# Patient Record
Sex: Male | Born: 1955 | Race: White | Hispanic: No | Marital: Married | Smoking: Former smoker
Health system: Southern US, Community
[De-identification: ages and names within clinical notes are randomized; demographics above are authoritative.]

## PROBLEM LIST (undated history)

## (undated) DIAGNOSIS — M1990 Unspecified osteoarthritis, unspecified site: Secondary | ICD-10-CM

## (undated) DIAGNOSIS — R05 Cough: Secondary | ICD-10-CM

## (undated) DIAGNOSIS — J189 Pneumonia, unspecified organism: Secondary | ICD-10-CM

## (undated) DIAGNOSIS — R0600 Dyspnea, unspecified: Secondary | ICD-10-CM

## (undated) DIAGNOSIS — R0609 Other forms of dyspnea: Secondary | ICD-10-CM

## (undated) DIAGNOSIS — R3912 Poor urinary stream: Secondary | ICD-10-CM

## (undated) DIAGNOSIS — C61 Malignant neoplasm of prostate: Secondary | ICD-10-CM

## (undated) DIAGNOSIS — R399 Unspecified symptoms and signs involving the genitourinary system: Secondary | ICD-10-CM

## (undated) DIAGNOSIS — J449 Chronic obstructive pulmonary disease, unspecified: Secondary | ICD-10-CM

## (undated) DIAGNOSIS — R058 Other specified cough: Secondary | ICD-10-CM

## (undated) DIAGNOSIS — N529 Male erectile dysfunction, unspecified: Secondary | ICD-10-CM

## (undated) DIAGNOSIS — J984 Other disorders of lung: Secondary | ICD-10-CM

## (undated) DIAGNOSIS — D509 Iron deficiency anemia, unspecified: Secondary | ICD-10-CM

## (undated) DIAGNOSIS — J439 Emphysema, unspecified: Secondary | ICD-10-CM

## (undated) DIAGNOSIS — Z973 Presence of spectacles and contact lenses: Secondary | ICD-10-CM

## (undated) HISTORY — DX: Unspecified osteoarthritis, unspecified site: M19.90

## (undated) HISTORY — DX: Emphysema, unspecified: J43.9

## (undated) HISTORY — PX: PROSTATE BIOPSY: SHX241

## (undated) HISTORY — PX: TONSILLECTOMY: SUR1361

---

## 2010-11-20 ENCOUNTER — Ambulatory Visit (INDEPENDENT_AMBULATORY_CARE_PROVIDER_SITE_OTHER): Payer: Self-pay | Admitting: Pulmonary Disease

## 2010-11-20 ENCOUNTER — Institutional Professional Consult (permissible substitution): Payer: Self-pay | Admitting: Pulmonary Disease

## 2010-11-20 ENCOUNTER — Encounter: Payer: Self-pay | Admitting: Pulmonary Disease

## 2010-11-20 VITALS — BP 102/72 | HR 104 | Temp 97.4°F | Ht 70.0 in | Wt 137.2 lb

## 2010-11-20 DIAGNOSIS — J984 Other disorders of lung: Secondary | ICD-10-CM

## 2010-11-20 NOTE — Assessment & Plan Note (Signed)
The patient's history is most consistent with an episode of acute pneumonia, however his chest x-ray shows bilateral upper lobe fibrotic cavitary disease with hilar retraction.  This would indicate there is an underlying chronic process.  It is unclear whether he may have stage IV sarcoidosis, significant upper lobe bullous lung disease, or some other type of granulomatous process.  His recent PPD was negative.  Unfortunately, the patient never goes to the doctor, and is unsure if he has ever had a chest x-ray in the past.  He does mention some type of scan 30 years ago, and was told that he had scarring.  He currently is improving from a clinical standpoint with the antibiotic.  I have asked him to continue with this, and to stay completely away from cigarettes.  He will need a CT scan of the chest for further evaluation.  Unfortunately, the patient has no insurance, but we will work with him the best we can.

## 2010-11-20 NOTE — Progress Notes (Signed)
  Subjective:    Patient ID: Troy Burch, male    DOB: June 09, 1955, 55 y.o.   MRN: [id number]  HPI The patient is a 55 year old male who I've been asked to see for an abnormal chest x-ray.  The patient is a long-time smoker, and states that he never goes to see the doctor.  Approximately one month ago, he began to have fatigue, shortness of breath, cough, mild congestion, but no mucus production.  He then began to have chills and night sweats.  He was seen at an urgent care, and a chest x-ray showed significant bilateral upper lobe disease.  He was felt to have pneumonia, and was placed on Avelox.  The patient states he began to cough up yellow-green mucus in small quantities, but has not had any hemoptysis.  He definitely is breathing better and feels better since being on the antibiotic, but remains weak.  He denies any significant breathing issues prior to this.  The patient is unsure if he's ever had a chest x-ray, but does state that he had some type of scan 30 years ago and was told that he had "scarring".  He has no history of tuberculosis or TB exposure, and his PPD was negative one week ago according to the patient.  Since he has been sick, his appetite has been very poor and he has been losing weight.   Review of Systems  Constitutional: Positive for appetite change and unexpected weight change. Negative for fever.  HENT: Negative for ear pain, nosebleeds, congestion, sore throat, rhinorrhea, sneezing, trouble swallowing, dental problem, postnasal drip and sinus pressure.   Eyes: Negative for redness and itching.  Respiratory: Positive for cough and shortness of breath. Negative for chest tightness and wheezing.   Cardiovascular: Positive for chest pain. Negative for palpitations and leg swelling.  Gastrointestinal: Negative for nausea and vomiting.  Genitourinary: Negative for dysuria.  Musculoskeletal: Positive for joint swelling.  Skin: Negative for rash.  Neurological: Negative  for headaches.  Hematological: Does not bruise/bleed easily.  Psychiatric/Behavioral: Negative for dysphoric mood. The patient is not nervous/anxious.        Objective:   Physical Exam Constitutional:  Thin male, no acute distress  HENT:  Nares patent without discharge, but deviated septum to right with narrowing.   Oropharynx without exudate, palate and uvula are normal  Eyes:  Perrla, eomi, no scleral icterus  Neck:  No JVD, no TMG  Cardiovascular:  Normal rate, regular rhythm, no rubs or gallops.  No murmurs        Intact distal pulses  Pulmonary :  Normal breath sounds, no stridor or respiratory distress   No rales, rhonchi, or wheezing  Abdominal:  Soft, nondistended, bowel sounds present.  No tenderness noted.   Musculoskeletal:  No lower extremity edema noted.  Lymph Nodes:  No cervical lymphadenopathy noted  Skin:  No cyanosis noted  Neurologic:  Alert, appropriate, moves all 4 extremities without obvious deficit.         Assessment & Plan:

## 2010-11-20 NOTE — Patient Instructions (Signed)
Stay off cigarettes Finish up antibiotics Will schedule for ct chest to evaluate your abnormal xray.  Will call you with results.

## 2010-11-24 ENCOUNTER — Ambulatory Visit (INDEPENDENT_AMBULATORY_CARE_PROVIDER_SITE_OTHER)
Admission: RE | Admit: 2010-11-24 | Discharge: 2010-11-24 | Disposition: A | Discharge status: Home / Self Care | Payer: Self-pay | Source: Ambulatory Visit | Attending: Pulmonary Disease | Admitting: Pulmonary Disease

## 2010-11-24 DIAGNOSIS — J984 Other disorders of lung: Secondary | ICD-10-CM

## 2010-11-25 ENCOUNTER — Telehealth: Payer: Self-pay | Admitting: Pulmonary Disease

## 2010-11-25 DIAGNOSIS — J984 Other disorders of lung: Secondary | ICD-10-CM

## 2010-11-25 NOTE — Telephone Encounter (Signed)
Please let pt know that his chest ct shows the upper half of both lungs are destroyed by emphysema, and there are large bubbles where his lungs used to be that have become infected and fluid filled. No obvious cancer seen, but can't exclude 100%. He is to stay off cigarettes, finish up his antibiotics, and come back to see [place] in 2 mos to check on his progress. Will check another plain cxr next visit.  I spoke with Verlon Au and she is aware of pt results and kc's recs. She wrote down word for word to make pt aware of this. Pt is scheduled to come in 11/12 at 3:45 w/ cxr prior. Wife is awre of this and will inform pt. Nothing further was needed

## 2010-12-02 ENCOUNTER — Telehealth: Payer: Self-pay | Admitting: Pulmonary Disease

## 2010-12-02 DIAGNOSIS — J984 Other disorders of lung: Secondary | ICD-10-CM

## 2010-12-02 MED ORDER — HYDROCODONE-HOMATROPINE 5-1.5 MG/5ML PO SYRP: ORAL_SOLUTION | ORAL | Status: DC

## 2010-12-02 NOTE — Telephone Encounter (Signed)
I spoke with pt's spouse, Troy Burch, and she states the pt's cough is worse. Cough is keeping him up at night and he is coughing up very little phlegm and complains that his chest is sore and hurts. He finished the 10 day course of Avelox on Tues., 9/11r. She states he has no energy and earlier this week he noticed to small bulges in his groin area and thinks this is from the cough. Pls advise.

## 2010-12-02 NOTE — Telephone Encounter (Signed)
Can call in hydromet cough syrup 5 cc q6h prn.  6 ounces, no fills. I would like to see him in 2 weeks with cxr same day.

## 2010-12-02 NOTE — Telephone Encounter (Signed)
Called, spoke with pt's wife, Verlon Au.  She was informed [facility] recs pt take hydromet cough syrup 5 cc q6h prn and would like to see him in 2 wks with cxr same day.  She verbalized understanding of this and aware rx will be called into [facility].  2 wk OV scheduled for Oct 3 at 9:15am -- Verlon Au aware and will have pt come in a little earlier for cxr prior to appt.  Hydromet rx called into [facility] and CXR order placed.

## 2010-12-16 ENCOUNTER — Ambulatory Visit (INDEPENDENT_AMBULATORY_CARE_PROVIDER_SITE_OTHER)
Admission: RE | Admit: 2010-12-16 | Discharge: 2010-12-16 | Disposition: A | Discharge status: Home / Self Care | Payer: Self-pay | Source: Ambulatory Visit | Attending: Pulmonary Disease | Admitting: Pulmonary Disease

## 2010-12-16 ENCOUNTER — Encounter: Payer: Self-pay | Admitting: Pulmonary Disease

## 2010-12-16 ENCOUNTER — Ambulatory Visit (INDEPENDENT_AMBULATORY_CARE_PROVIDER_SITE_OTHER): Payer: Self-pay | Admitting: Pulmonary Disease

## 2010-12-16 VITALS — BP 98/62 | HR 106 | Temp 97.0°F | Ht 70.0 in | Wt 132.4 lb

## 2010-12-16 DIAGNOSIS — J984 Other disorders of lung: Secondary | ICD-10-CM

## 2010-12-16 DIAGNOSIS — J4489 Other specified chronic obstructive pulmonary disease: Secondary | ICD-10-CM

## 2010-12-16 DIAGNOSIS — J449 Chronic obstructive pulmonary disease, unspecified: Secondary | ICD-10-CM | POA: Insufficient documentation

## 2010-12-16 MED ORDER — AMOXICILLIN-POT CLAVULANATE 875-125 MG PO TABS: 1.0000 | ORAL_TABLET | Freq: Two times a day (BID) | ORAL | Status: AC

## 2010-12-16 NOTE — Progress Notes (Signed)
  Subjective:    Patient ID: Troy Burch, male    DOB: [birth year], 55 y.o.   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  He has been treated with a course of Avelox with significant improvement, and has also quit smoking.  He has had a CT of his chest which showed severe destruction of both upper lung zones, with large bullae with air-fluid levels.  The patient is greatly improved, and is breathing better, but is still coughing up purulent mucus.  He also has severe dyspnea on exertion.  He denies any fevers or chills.   Review of Systems  Constitutional: Negative for fever and unexpected weight change.  HENT: Negative for ear pain, nosebleeds, congestion, sore throat, rhinorrhea, sneezing, trouble swallowing, dental problem, postnasal drip and sinus pressure.   Eyes: Negative for redness and itching.  Respiratory: Positive for cough and shortness of breath. Negative for chest tightness and wheezing.   Cardiovascular: Negative for palpitations and leg swelling.  Gastrointestinal: Negative for nausea and vomiting.  Genitourinary: Negative for dysuria.  Musculoskeletal: Negative for joint swelling.  Skin: Negative for rash.  Neurological: Negative for headaches.  Hematological: Does not bruise/bleed easily.  Psychiatric/Behavioral: Negative for dysphoric mood. The patient is not nervous/anxious.        Objective:   Physical Exam Thin male in no acute distress Nose without purulence or discharge noted Chest bronchial breath sounds in the upper lung zones, no wheezes Cardiac exam with regular rate and rhythm Lower extremities without edema, no cyanosis noted Alert and oriented, moves all 4 extremities.       Assessment & Plan:

## 2010-12-16 NOTE — Progress Notes (Signed)
Addended by: Salli Quarry on: 12/16/2010 09:40 AM   Modules accepted: Orders

## 2010-12-16 NOTE — Assessment & Plan Note (Signed)
The patient has severe destruction of his upper lobes with superimposed infection.  I cannot exclude atypical infections, or possibly underlying cancer, however I do think he needs a more prolonged course of antibiotics given his persistent symptoms.  We'll therefore start him on Augmentin for the next 2 weeks.  He will need a followup chest x-ray in 8 weeks.

## 2010-12-16 NOTE — Patient Instructions (Signed)
Will treat longer with antibiotics given the severity of your lung disease and persistent infected mucus Will start spiriva one inhalation each am for next one month.  Let us know if helps breathing.  followup with me in 8weeks with chest xray same day.

## 2010-12-16 NOTE — Assessment & Plan Note (Signed)
The patient has never had pulmonary function studies, and unfortunately he has no insurance.  It is obvious that he has severe bullous emphysema, and we'll therefore start him on Spiriva as a trial.  I have also stressed to him the importance of staying off cigarettes.

## 2011-01-25 ENCOUNTER — Ambulatory Visit: Payer: Self-pay | Admitting: Pulmonary Disease

## 2011-02-09 ENCOUNTER — Other Ambulatory Visit: Payer: Self-pay | Admitting: Pulmonary Disease

## 2011-02-09 DIAGNOSIS — J984 Other disorders of lung: Secondary | ICD-10-CM

## 2011-02-09 DIAGNOSIS — J449 Chronic obstructive pulmonary disease, unspecified: Secondary | ICD-10-CM

## 2011-02-10 ENCOUNTER — Ambulatory Visit (INDEPENDENT_AMBULATORY_CARE_PROVIDER_SITE_OTHER)
Admission: RE | Admit: 2011-02-10 | Discharge: 2011-02-10 | Disposition: A | Discharge status: Home / Self Care | Payer: Self-pay | Source: Ambulatory Visit | Attending: Pulmonary Disease | Admitting: Pulmonary Disease

## 2011-02-10 ENCOUNTER — Ambulatory Visit (INDEPENDENT_AMBULATORY_CARE_PROVIDER_SITE_OTHER): Payer: Self-pay | Admitting: Pulmonary Disease

## 2011-02-10 ENCOUNTER — Encounter: Payer: Self-pay | Admitting: Pulmonary Disease

## 2011-02-10 VITALS — BP 112/72 | HR 101 | Ht 70.0 in | Wt 149.0 lb

## 2011-02-10 DIAGNOSIS — J984 Other disorders of lung: Secondary | ICD-10-CM

## 2011-02-10 DIAGNOSIS — J449 Chronic obstructive pulmonary disease, unspecified: Secondary | ICD-10-CM

## 2011-02-10 DIAGNOSIS — J4489 Other specified chronic obstructive pulmonary disease: Secondary | ICD-10-CM

## 2011-02-10 NOTE — Assessment & Plan Note (Addendum)
The pt has moderate copd by his spirometry today, and has had a positive response to spiriva.  Unfortunately, he does not have any insurance and cannot afford the medication, so we'll provide him with samples today and refer him to the patient assistance program.  Will also check an alpha one antitrypsin level today.

## 2011-02-10 NOTE — Assessment & Plan Note (Signed)
The patient has been treated with a prolonged course of Avelox, followed by another 2 weeks of Augmentin.  Overall, he had at least 4 weeks of antibiotics.  He feels much improved, his appetite has increased, and he is now gaining weight.  His congestion has significantly decreased and he is only coughing up small quantities of mucus currently.  His chest x-ray today shows some increase in density in the right upper lobe, and he continues to have air fluid levels in both apices.  I have explained to the patient this process is very difficult to treat with antibiotics because of poor blood flow, and he may ultimately need a lobectomy on the right.  Right now, he is satisfied with how he is doing, and there are no indications of a worsening infection.  I would like to culture his sputum, and monitor him at this point.  If he has worsening symptoms, he will obviously need antibiotics again and also consider a possible lobectomy.

## 2011-02-10 NOTE — Patient Instructions (Addendum)
Stay on spiriva one inhalation each am.  Will refer you to patient assistance program at drug company.  Albuterol inhaler 2 puffs up to every 4-6 hrs only if needed for emergencies. Will need to check sputum culture to see what kind of bugs are in your lungs. Will hold off on further antibiotics for now, but please call if you begin to feel worse or have worsening cough and congestion with increased mucus.  Will check finger stick blood test to look for hereditary form of emphysema.  followup with me in 3mos.

## 2011-02-10 NOTE — Progress Notes (Signed)
  Subjective:    Patient ID: Troy Burch, male    DOB: Aug 04, 1955, 55 y.o.   MRN: [id number]  HPI The patient comes in today for followup of his severe bullous lung disease and probably COPD.  He has had air-fluid levels and increased density in both upper lobes, and has been treated with a prolonged course of Avelox and then Augmentin.  He feels that he is clearly better from the last visit, with decreased cough and congestion, but continues to bring up purulent mucus.  His appetite has come back, and his weight is increasing.  He was also tried on Spiriva for presumed COPD, and saw significant improvement.  He has since run out of the medication, and we'll need to get on the patient assistance program.  He did have a chest x-ray today that shows persistent density in both apices, and it may be increased compared to the last film on the right.   Review of Systems  Constitutional: Negative for fever and unexpected weight change.  HENT: Negative for ear pain, nosebleeds, congestion, sore throat, rhinorrhea, sneezing, trouble swallowing, dental problem, postnasal drip and sinus pressure.   Eyes: Positive for itching. Negative for redness.  Respiratory: Positive for cough, shortness of breath and wheezing. Negative for chest tightness.   Cardiovascular: Negative for palpitations and leg swelling.  Gastrointestinal: Negative for nausea and vomiting.  Genitourinary: Negative for dysuria.  Musculoskeletal: Negative for joint swelling.  Skin: Negative for rash.  Neurological: Negative for headaches.  Hematological: Bruises/bleeds easily.  Psychiatric/Behavioral: Negative for dysphoric mood. The patient is not nervous/anxious.        Objective:   Physical Exam Thin male in no acute distress Nares without purulence or discharge noted Chest with decreased breath sounds but no wheezes or rhonchi Cardiac exam with regular rate and rhythm Lower extremities without edema, no cyanosis  noted Alert and oriented, moves all 4 extremities.       Assessment & Plan:

## 2011-02-12 ENCOUNTER — Other Ambulatory Visit: Payer: Self-pay

## 2011-02-12 DIAGNOSIS — J984 Other disorders of lung: Secondary | ICD-10-CM

## 2011-02-12 DIAGNOSIS — J449 Chronic obstructive pulmonary disease, unspecified: Secondary | ICD-10-CM

## 2011-02-15 LAB — RESPIRATORY CULTURE OR RESPIRATORY AND SPUTUM CULTURE

## 2011-02-26 ENCOUNTER — Ambulatory Visit (INDEPENDENT_AMBULATORY_CARE_PROVIDER_SITE_OTHER): Payer: Self-pay | Admitting: General Surgery

## 2011-02-26 ENCOUNTER — Encounter (INDEPENDENT_AMBULATORY_CARE_PROVIDER_SITE_OTHER): Payer: Self-pay | Admitting: General Surgery

## 2011-02-26 VITALS — BP 132/86 | HR 80 | Temp 98.4°F | Resp 20 | Ht 70.0 in | Wt 146.0 lb

## 2011-02-26 DIAGNOSIS — K4090 Unilateral inguinal hernia, without obstruction or gangrene, not specified as recurrent: Secondary | ICD-10-CM

## 2011-02-26 MED ORDER — TAMSULOSIN HCL 0.4 MG PO CAPS: 0.4000 mg | ORAL_CAPSULE | Freq: Every day | ORAL | Status: DC

## 2011-02-26 MED ORDER — ABDOMINAL BINDER/ELASTIC SMALL MISC: 1.0000 | Freq: Once | Status: DC

## 2011-02-26 NOTE — Patient Instructions (Signed)
We will get medical clearance from your lung doctor before scheduling surgery  Hernia, Surgical Repair A hernia occurs when an internal organ pushes out through a weak spot in the belly (abdominal) wall muscles. Hernias commonly occur in the groin and around the navel. Hernias often can be pushed back into place (reduced). Most hernias tend to get worse over time. Problems occur when abdominal contents get stuck in the opening (incarcerated hernia). The blood supply gets cut off (strangulated hernia). This is an emergency and needs surgery. Otherwise, hernia repair can be an elective procedure. This means you can schedule this at your convenience when an emergency is not present. Because complications can occur, if you decide to repair the hernia, it is best to do it soon. When it becomes an emergency procedure, there is increased risk of complications after surgery. CAUSES   Heavy lifting.   Obesity.   Prolonged coughing.   Straining to move your bowels.   Hernias can also occur through a cut (incision) by a surgeonafter an abdominal operation.  HOME CARE INSTRUCTIONS Before the repair:  Bed rest is not required. You may continue your normal activities, but avoid heavy lifting (more than 10 pounds) or straining. Cough gently. If you are a smoker, it is best to stop. Even the best hernia repair can break down with the continual strain of coughing.   Do not wear anything tight over your hernia. Do not try to keep it in with an outside bandage or truss. These can damage abdominal contents if they are trapped in the hernia sac.   Eat a normal diet. Avoid constipation. Straining over long periods of time to have a bowel movement will increase hernia size. It also can breakdown repairs. If you cannot do this with diet alone, laxatives or stool softeners may be used.  PRIOR TO SURGERY, SEEK IMMEDIATE MEDICAL CARE IF: You have problems (symptoms) of a trapped (incarcerated) hernia. Symptoms  include:  An oral temperature above 102 F (38.9 C) develops, or as your caregiver suggests.   Increasing abdominal pain.   Feeling sick to your stomach(nausea) and vomiting.   You stop passing gas or stool.   The hernia is stuck outside the abdomen, looks discolored, feels hard, or is tender.   You have any changes in your bowel habits or in the hernia that is unusual for you.  LET YOUR CAREGIVERS KNOW ABOUT THE FOLLOWING:  Allergies.   Medications taken including herbs, eye drops, over the counter medications, and creams.   Use of steroids (by mouth or creams).   Family or personal history of problems with anesthetics or Novocaine.   Possibility of pregnancy, if this applies.   Personal history of blood clots (thrombophlebitis).   Family or personal history of bleeding or blood problems.   Previous surgery.   Other health problems.  BEFORE THE PROCEDURE You should be present 1 hour prior to your procedure, or as directed by your caregiver.  AFTER THE PROCEDURE After surgery, you will be taken to the recovery area. A nurse will watch and check your progress there. Once you are awake, stable, and taking fluids well, you will be allowed to go home as long as there are no problems. Once home, an ice pack (wrapped in a light towel) applied to your operative site may help with discomfort. It may also keep the swelling down. Do not lift anything heavier than 10 pounds (4.55 kilograms). Take showers not baths. Do not drive while taking narcotics. Follow  instructions as suggested by your caregiver.  SEEK IMMEDIATE MEDICAL CARE IF: After surgery:  There is redness, swelling, or increasing pain in the wound.   There is pus coming from the wound.   There is drainage from a wound lasting longer than 1 day.   An unexplained oral temperature above 102 F (38.9 C) develops.   You notice a foul smell coming from the wound or dressing.   There is a breaking open of a wound (edged  not staying together) after the sutures have been removed.   You notice increasing pain in the shoulders (shoulder strap areas).   You develop dizzy episodes or fainting while standing.   You develop persistent nausea or vomiting.   You develop a rash.   You have difficulty breathing.   You develop any reaction or side effects to medications given.  MAKE SURE YOU:   Understand these instructions.   Will watch your condition.   Will get help right away if you are not doing well or get worse.  Document Released: 08/25/2000 Document Revised: 11/11/2010 Document Reviewed: 07/18/2007 [facility] Patient Information 2012 [place], [place].

## 2011-02-26 NOTE — Progress Notes (Signed)
Patient ID: Troy Burch, male   DOB: 1955/08/02, 55 y.o.   MRN: [id number]  Chief Complaint  Patient presents with  . New Evaluation    eval of LIH    HPI Troy Burch is a 55 y.o. male.   HPI  55 year old Caucasian male with severe COPD and emphysema referred by his primary care physician for evaluation of a left inguinal hernia. The patient stated that he developed a bulge in his left groin about 2 months ago. He states that it causes a lot of discomfort. It will occasionally burn. He denies any fevers, chills, nausea, vomiting, diarrhea, or constipation. He denies any dysuria.  He has emphysema and uses 2 inhalers. He also has a chronic cough. He denies any weight loss.  Past Medical History  Diagnosis Date  . COPD (chronic obstructive pulmonary disease)   . Emphysema of lung     Past Surgical History  Procedure Date  . Tonsillectomy     History reviewed. No pertinent family history.  Social History History  Substance Use Topics  . Smoking status: Former Smoker -- 2.0 packs/day for 30 years    Types: Cigarettes    Quit date: 11/07/2010  . Smokeless tobacco: Never Used  . Alcohol Use: Yes    No Known Allergies  Current Outpatient Prescriptions  Medication Sig Dispense Refill  . albuterol (PROVENTIL HFA;VENTOLIN HFA) 108 (90 BASE) MCG/ACT inhaler Inhale 2 puffs into the lungs every 6 (six) hours as needed.        . tiotropium (SPIRIVA) 18 MCG inhalation capsule Place 18 mcg into inhaler and inhale daily.          Review of Systems Review of Systems  Constitutional: Positive for fatigue. Negative for fever, appetite change and unexpected weight change.  HENT: Positive for hearing loss and congestion. Negative for neck pain and neck stiffness.   Eyes: Negative for discharge and itching.       +glasses  Respiratory: Positive for cough.        +DOE; no orthnopea; no PND  Cardiovascular: Negative for chest pain and palpitations.  Gastrointestinal:  Negative for nausea, diarrhea, constipation, blood in stool and abdominal distention.  Genitourinary: Negative for dysuria, frequency, hematuria and testicular pain.       +nocturia; + weak stream  Musculoskeletal: Negative.   Neurological: Negative for speech difficulty, light-headedness and headaches.  Hematological: Negative for adenopathy.  Psychiatric/Behavioral: Negative.     Blood pressure 132/86, pulse 80, temperature 98.4 F (36.9 C), temperature source Temporal, resp. rate 20, height 5\' 10"  (1.778 m), weight 146 lb (66.225 kg).  Physical Exam Physical Exam  Vitals reviewed. Constitutional: He is oriented to person, place, and time. He appears well-developed and well-nourished. No distress.  HENT:  Head: Normocephalic and atraumatic.  Eyes: Conjunctivae are normal. No scleral icterus.  Neck: Normal range of motion. Neck supple. No JVD present. No tracheal deviation present. No thyromegaly present.  Cardiovascular: Normal rate, regular rhythm and normal heart sounds.   Pulmonary/Chest: Effort normal. No respiratory distress. He has no decreased breath sounds. He has wheezes (slight inspir wheezes b/l). He has no rhonchi. He has no rales.  Abdominal: Soft. Bowel sounds are normal. He exhibits no distension. There is no tenderness. A hernia is present. Hernia confirmed positive in the left inguinal area (reducible).  Genitourinary: Penis normal.  Musculoskeletal: Normal range of motion. He exhibits no edema and no tenderness.  Lymphadenopathy:    He has no cervical adenopathy.  Neurological: He  is alert and oriented to person, place, and time. He exhibits normal muscle tone.  Skin: Skin is warm and dry. No rash noted. No erythema.  Psychiatric: He has a normal mood and affect. His behavior is normal. Judgment and thought content normal.    Data Reviewed Dr Teddy Spike note CXR  Assessment    Left inguinal hernia    Plan    We discussed the etiology of inguinal hernias.  We discussed the signs & symptoms of incarceration & strangulation.  We discussed non-operative and operative management. Because of his severe COPD, I think it will best to avoid general anesthesia.  Therefore, this leaves a open repair as the only surgical option.  I think we could do a open repair under MAC with regional block or spinal.  We will obtain a preop anesthesia consult.  We will also obtain medical clearance from Dr Shelle Iron.   The patient has elected to proceed with open left inguinal hernia with mesh    I described the procedure in detail.  The patient was given educational material. We discussed the risks and benefits including but not limited to bleeding, infection, chronic inguinal pain, nerve entrapment, hernia recurrence, mesh complications, hematoma formation, urinary retention, injury to the testicles, numbness in the groin, blood clots, injury to the surrounding structures, and anesthesia risk. We also discussed the typical post operative recovery course, including no heavy lifting for 4-6 weeks. I explained that the likelihood of improvement of their symptoms is good. I explained that he is at higher risk for perioperative complications including hernia recurrence because of his underlying COPD and emphysema  Will plan on perioperative Flomax. In the interim, he will be fitted with a truss.  Mary Sella. Andrey Campanile, MD, FACS General, Bariatric, & Minimally Invasive Surgery [facility], [place]         [facility] M 02/26/2011, 2:46 PM

## 2011-03-05 ENCOUNTER — Telehealth: Payer: Self-pay | Admitting: Pulmonary Disease

## 2011-03-05 DIAGNOSIS — J449 Chronic obstructive pulmonary disease, unspecified: Secondary | ICD-10-CM

## 2011-03-05 NOTE — Telephone Encounter (Signed)
Please let pt know that his genetic testing for hereditary form of emphysema is negative.  Good news.

## 2011-03-05 NOTE — Telephone Encounter (Signed)
[id number] x1 

## 2011-03-08 NOTE — Telephone Encounter (Signed)
Pt aware test results are negative per Dr. Shelle Iron.

## 2011-03-08 NOTE — Telephone Encounter (Signed)
Troy Burch (wife) returned call Troy Burch

## 2011-03-17 ENCOUNTER — Telehealth (INDEPENDENT_AMBULATORY_CARE_PROVIDER_SITE_OTHER): Payer: Self-pay | Admitting: General Surgery

## 2011-03-17 ENCOUNTER — Telehealth: Payer: Self-pay | Admitting: Pulmonary Disease

## 2011-03-17 NOTE — Telephone Encounter (Signed)
[facility] Jade, office is closed.  Meg, have you seen a surgery clearance document on this patient?  Thanks.

## 2011-03-17 NOTE — Telephone Encounter (Signed)
I have sent a request for clearance on 02/26/11 and 03/03/11. I called to check on clearance request 03/17/11. Spoke with [place] who will get message to Dr Teddy Spike nurse to contact me back re: this. Patient made aware this is what we are waiting on.

## 2011-03-18 NOTE — Telephone Encounter (Signed)
I spoke with Aundra Millet and she states she has not received surgery clearance document. I called to speak with Lesly Rubenstein and was advised by the nurse station to send her this message through epic. Will forward to [facility] to have her fax it to our triage fax [phone] Attn: Aundra Millet

## 2011-03-18 NOTE — Telephone Encounter (Signed)
Clearance re-faxed to provided number.

## 2011-03-18 NOTE — Telephone Encounter (Signed)
We have received fax and was placed in [facility] VIP folder. Please advise Dr. Shelle Iron, thanks

## 2011-03-18 NOTE — Telephone Encounter (Signed)
done

## 2011-03-19 NOTE — Telephone Encounter (Signed)
Paperwork faxed back to [facility] at Dr. Tawana Scale office

## 2011-03-19 NOTE — Telephone Encounter (Signed)
Clearance received. Dr Andrey Campanile to write orders. Patient made aware our scheduling department will be in touch with him.

## 2011-03-22 ENCOUNTER — Other Ambulatory Visit (INDEPENDENT_AMBULATORY_CARE_PROVIDER_SITE_OTHER): Payer: Self-pay | Admitting: General Surgery

## 2011-03-24 ENCOUNTER — Encounter: Payer: Self-pay | Admitting: Pulmonary Disease

## 2011-03-28 LAB — AFB CULTURE WITH SMEAR (NOT AT ARMC)

## 2011-03-31 ENCOUNTER — Encounter (HOSPITAL_COMMUNITY): Payer: Self-pay

## 2011-04-09 ENCOUNTER — Encounter (HOSPITAL_COMMUNITY)
Admission: RE | Admit: 2011-04-09 | Discharge: 2011-04-09 | Disposition: A | Discharge status: Home / Self Care | Payer: Self-pay | Source: Ambulatory Visit | Attending: General Surgery | Admitting: General Surgery

## 2011-04-09 ENCOUNTER — Encounter (HOSPITAL_COMMUNITY): Payer: Self-pay

## 2011-04-09 ENCOUNTER — Encounter (HOSPITAL_COMMUNITY)
Admission: RE | Admit: 2011-04-09 | Discharge: 2011-04-09 | Disposition: A | Discharge status: Home / Self Care | Payer: Self-pay | Source: Ambulatory Visit | Attending: Anesthesiology | Admitting: Anesthesiology

## 2011-04-09 HISTORY — DX: Pneumonia, unspecified organism: J18.9

## 2011-04-09 LAB — BASIC METABOLIC PANEL
CO2: 25 mEq/L (ref 19–32)
Calcium: 8.9 mg/dL (ref 8.4–10.5)
Chloride: 101 mEq/L (ref 96–112)
Creatinine, Ser: 0.98 mg/dL (ref 0.50–1.35)
Glucose, Bld: 91 mg/dL (ref 70–99)

## 2011-04-09 LAB — DIFFERENTIAL
Eosinophils Relative: 2 % (ref 0–5)
Lymphs Abs: 2 10*3/uL (ref 0.7–4.0)
Monocytes Absolute: 1.4 10*3/uL — ABNORMAL HIGH (ref 0.1–1.0)
Monocytes Relative: 9 % (ref 3–12)
Neutro Abs: 12 10*3/uL — ABNORMAL HIGH (ref 1.7–7.7)

## 2011-04-09 LAB — CBC
HCT: 30.7 % — ABNORMAL LOW (ref 39.0–52.0)
MCH: 21.3 pg — ABNORMAL LOW (ref 26.0–34.0)
MCV: 71.7 fL — ABNORMAL LOW (ref 78.0–100.0)
Platelets: 471 10*3/uL — ABNORMAL HIGH (ref 150–400)
RBC: 4.28 MIL/uL (ref 4.22–5.81)
WBC: 15.7 10*3/uL — ABNORMAL HIGH (ref 4.0–10.5)

## 2011-04-09 LAB — SURGICAL PCR SCREEN: Staphylococcus aureus: NEGATIVE

## 2011-04-09 MED ORDER — CEFAZOLIN SODIUM 1-5 GM-% IV SOLN: 1.0000 g | INTRAVENOUS | Status: DC

## 2011-04-09 NOTE — Pre-Procedure Instructions (Signed)
18 Troy Burch  04/09/2011   Your procedure is scheduled on:  04/15/2011  Report to [facility] at 0800 AM.  Call this number if you have problems the morning of surgery: [phone]   Remember:   Do not eat food:After Midnight.  May have clear liquids: up to 4 Hours before arrival.  Clear liquids include soda, tea, black coffee, apple or grape juice, broth.  Take these medicines the morning of surgery with A SIP OF WATER: Albuterol Inhaler (bring with you to hosp), Flomax, spiriva   Do not wear jewelry, make-up or nail polish.  Do not wear lotions, powders, or perfumes. You may wear deodorant.  Do not shave 48 hours prior to surgery.  Do not bring valuables to the hospital.  Contacts, dentures or bridgework may not be worn into surgery.  Leave suitcase in the car. After surgery it may be brought to your room.  For patients admitted to the hospital, checkout time is 11:00 AM the day of discharge.   Patients discharged the day of surgery will not be allowed to drive home.  Name and phone number of your driver: wife Troy Burch- [phone]  Special Instructions: CHG Shower Use Special Wash: 1/2 bottle night before surgery and 1/2 bottle morning of surgery.   Please read over the following fact sheets that you were given: Pain Booklet, Coughing and Deep Breathing, MRSA Information and Surgical Site Infection Prevention

## 2011-04-09 NOTE — Progress Notes (Signed)
ekg received from [facility]   11/14/2010

## 2011-04-12 NOTE — Consult Note (Addendum)
Anesthesia:  Patient is a 56 year old male scheduled for a left IHR on 04/15/11.  History includes COPD/emphysema, former smoker, and prior history of RUL PNA/cavitary lung disease.  According to Dr. Tawana Scale 02/26/11 note, he is planning to do an open repair in hopes that it can be done under MAC with regional block or a spinal (to avoid GA due to his "severe" COPD). I was not asked to see him during his PAT visit.  Dr. Andrey Campanile did contact Dr. Shelle Iron who felt patient would be a "mild risk for breathing issues and pulmonary infections after surgery" (see Media tab).   He began seeing Dr. Shelle Iron in September of 2012.  Dr. Shelle Iron categorizes his COPD as "moderate" by spirometry.  He also has cavitary lung disease and according to his 02/10/11 note, he is being monitored after several weeks of antibiotic therapy and if worsening symptoms would consider repeating antibiotics and possibly lobectomy.  See Media tab for Spirometry results from 02/10/11.  His CXR from 04/09/11 showed: No change in severe bullous disease in both upper lobes with scarring and air-fluid levels suggesting possible infected bulla.   AFB cultures from 02/12/11.  Respiratory C&S/gram stain came back normal oropharyngeal flora .  Alpha-1 antitrypsin tests were reported as negative.  Labs noted. H/H 9.1/30.7.  I'll order a T&S for the day of surgery.  His WBC was 15.7K.  There are currently no comparison labs.  He was afebrile at PAT.  Sats were 97%. R 20, HR 104.  His EKG from 11/14/10 from [facility] [phone]) showed SR, poor R wave progression in V2-4.  Currently there are no comparison EKGs.  Clinical correlation on the day of surgery.  Addendum:  04/13/11 1220  I updated Anesthesiologist Dr. Noreene Larsson on the patient's pulmonary history.  He is anticipating the procedure can be done under spinal anesthesia.  However, the patient's assigned Anesthesiologist will evaluate the patient and review his pulmonary records on the day of surgery  and determine the definitive anesthesia plan at that time.

## 2011-04-12 NOTE — Anesthesia Preprocedure Evaluation (Addendum)
Anesthesia Evaluation  Patient identified by MRN, date of birth, ID band Patient awake    Reviewed: Allergy & Precautions, H&P , NPO status , Patient's Chart, lab work & pertinent test results, reviewed documented beta blocker date and time   History of Anesthesia Complications Negative for: history of anesthetic complications  Airway Mallampati: II TM Distance: >3 FB     Dental  (+) Dental Advisory Given   Pulmonary pneumonia , COPD Mod-sev COPD, cavitary lung disease (Dr.Clance)   Pulmonary exam normal       Cardiovascular neg cardio ROS Regular - Systolic murmurs    Neuro/Psych Negative Neurological ROS  Negative Psych ROS   GI/Hepatic negative GI ROS, Neg liver ROS,   Endo/Other  Negative Endocrine ROS  Renal/GU negative Renal ROS     Musculoskeletal   Abdominal   Peds  Hematology negative hematology ROS (+)   Anesthesia Other Findings   Reproductive/Obstetrics                          Anesthesia Physical Anesthesia Plan  ASA: III  Anesthesia Plan: Spinal and MAC   Post-op Pain Management: MAC Combined w/ Regional for Post-op pain   Induction:   Airway Management Planned: Simple Face Mask  Additional Equipment:   Intra-op Plan:   Post-operative Plan:   Informed Consent: I have reviewed the patients History and Physical, chart, labs and discussed the procedure including the risks, benefits and alternatives for the proposed anesthesia with the patient or authorized representative who has indicated his/her understanding and acceptance.     Plan Discussed with: CRNA and Anesthesiologist  Anesthesia Plan Comments: (See Dr. Tawana Scale Note.  Posted as [facility].  His note mentions open repair with MAC with regional block or spinal.  Shonna Chock, PA-C )       Anesthesia Quick Evaluation

## 2011-04-13 ENCOUNTER — Telehealth (INDEPENDENT_AMBULATORY_CARE_PROVIDER_SITE_OTHER): Payer: Self-pay | Admitting: General Surgery

## 2011-04-13 DIAGNOSIS — D72829 Elevated white blood cell count, unspecified: Secondary | ICD-10-CM

## 2011-04-13 NOTE — Telephone Encounter (Signed)
Message copied by Liliana Cline on Tue Apr 13, 2011  2:49 PM ------      Message from: Andrey Campanile, ERIC M      Created: Tue Apr 13, 2011  2:21 PM       Pt needs REPEAT CBC with diffl.  pls also order UA with cult. ASAP      If WBC is still elevated on repeat cbc, will need to cancel hernia repair and will need to see PCP regarding elevated wbc.

## 2011-04-13 NOTE — Telephone Encounter (Signed)
Spoke to patient and made aware of his elevated white count. He is not having any signs of illness. Made aware we need to repeat labs. I spoke with him and his wife and they will go to [facility] first thing in the morning.

## 2011-04-13 NOTE — Telephone Encounter (Addendum)
Called patient and left message on machine for him to call back as soon as he can and ask not to get transferred to my voicemail. Patient should have started flomax.

## 2011-04-14 ENCOUNTER — Telehealth (INDEPENDENT_AMBULATORY_CARE_PROVIDER_SITE_OTHER): Payer: Self-pay | Admitting: General Surgery

## 2011-04-14 LAB — CBC WITH DIFFERENTIAL/PLATELET
Eosinophils Absolute: 0.4 10*3/uL (ref 0.0–0.7)
Eosinophils Relative: 3 % (ref 0–5)
HCT: 30.9 % — ABNORMAL LOW (ref 39.0–52.0)
Hemoglobin: 9 g/dL — ABNORMAL LOW (ref 13.0–17.0)
Lymphocytes Relative: 17 % (ref 12–46)
Lymphs Abs: 2 10*3/uL (ref 0.7–4.0)
MCH: 20.8 pg — ABNORMAL LOW (ref 26.0–34.0)
MCV: 71.5 fL — ABNORMAL LOW (ref 78.0–100.0)
Monocytes Absolute: 1 10*3/uL (ref 0.1–1.0)
Monocytes Relative: 8 % (ref 3–12)
RBC: 4.32 MIL/uL (ref 4.22–5.81)
WBC: 12.2 10*3/uL — ABNORMAL HIGH (ref 4.0–10.5)

## 2011-04-14 NOTE — Telephone Encounter (Signed)
Spoke with patient, made him aware Dr Andrey Campanile reviewed labs and agreed to proceed with surgery. He has been taking flomax since yesterday and will proceed with surgery tomorrow.

## 2011-04-15 ENCOUNTER — Encounter (HOSPITAL_COMMUNITY): Payer: Self-pay | Admitting: Vascular Surgery

## 2011-04-15 ENCOUNTER — Ambulatory Visit (HOSPITAL_COMMUNITY)
Admission: RE | Admit: 2011-04-15 | Discharge: 2011-04-15 | Disposition: A | Discharge status: Home / Self Care | Payer: Self-pay | Source: Ambulatory Visit | Attending: General Surgery | Admitting: General Surgery

## 2011-04-15 ENCOUNTER — Ambulatory Visit (HOSPITAL_COMMUNITY): Payer: Self-pay | Admitting: Vascular Surgery

## 2011-04-15 ENCOUNTER — Encounter (HOSPITAL_COMMUNITY)
Admission: RE | Disposition: A | Discharge status: Home / Self Care | Payer: Self-pay | Source: Ambulatory Visit | Attending: General Surgery

## 2011-04-15 DIAGNOSIS — J438 Other emphysema: Secondary | ICD-10-CM | POA: Insufficient documentation

## 2011-04-15 DIAGNOSIS — Z01812 Encounter for preprocedural laboratory examination: Secondary | ICD-10-CM | POA: Insufficient documentation

## 2011-04-15 DIAGNOSIS — Z01818 Encounter for other preprocedural examination: Secondary | ICD-10-CM | POA: Insufficient documentation

## 2011-04-15 DIAGNOSIS — K4090 Unilateral inguinal hernia, without obstruction or gangrene, not specified as recurrent: Secondary | ICD-10-CM | POA: Insufficient documentation

## 2011-04-15 HISTORY — PX: INGUINAL HERNIA REPAIR: SHX194

## 2011-04-15 LAB — ABO/RH: ABO/RH(D): A POS

## 2011-04-15 LAB — TYPE AND SCREEN
ABO/RH(D): A POS
Antibody Screen: NEGATIVE

## 2011-04-15 SURGERY — REPAIR, HERNIA, INGUINAL, ADULT
Anesthesia: Spinal | Site: Groin | Laterality: Left | Wound class: Clean

## 2011-04-15 MED ORDER — FENTANYL CITRATE 0.05 MG/ML IJ SOLN
50.0000 ug | INTRAMUSCULAR | Status: DC | PRN
  Administered 2011-04-15: 100 ug via INTRAVENOUS

## 2011-04-15 MED ORDER — PROPOFOL 10 MG/ML IV BOLUS
INTRAVENOUS | Status: DC | PRN
  Administered 2011-04-15 (×5): 20 mg via INTRAVENOUS
  Administered 2011-04-15: 10 mg via INTRAVENOUS
  Administered 2011-04-15 (×3): 20 mg via INTRAVENOUS

## 2011-04-15 MED ORDER — DROPERIDOL 2.5 MG/ML IJ SOLN: 0.6250 mg | INTRAMUSCULAR | Status: DC | PRN

## 2011-04-15 MED ORDER — FENTANYL CITRATE 0.05 MG/ML IJ SOLN
INTRAMUSCULAR | Status: DC | PRN
  Administered 2011-04-15: 10 ug via INTRATHECAL

## 2011-04-15 MED ORDER — BUPIVACAINE LIPOSOME 1.3 % IJ SUSP
20.0000 mL | INTRAMUSCULAR | Status: DC
  Filled 2011-04-15: qty 20

## 2011-04-15 MED ORDER — CEFAZOLIN SODIUM 1-5 GM-% IV SOLN
INTRAVENOUS | Status: DC | PRN
  Administered 2011-04-15: 1 g via INTRAVENOUS

## 2011-04-15 MED ORDER — OXYCODONE-ACETAMINOPHEN 5-325 MG PO TABS: 1.0000 | ORAL_TABLET | ORAL | Status: AC | PRN

## 2011-04-15 MED ORDER — PHENYLEPHRINE HCL 10 MG/ML IJ SOLN
INTRAMUSCULAR | Status: DC | PRN
  Administered 2011-04-15: 80 ug via INTRAVENOUS
  Administered 2011-04-15: 40 ug via INTRAVENOUS
  Administered 2011-04-15: 80 ug via INTRAVENOUS
  Administered 2011-04-15 (×3): 40 ug via INTRAVENOUS

## 2011-04-15 MED ORDER — CEFAZOLIN SODIUM 1-5 GM-% IV SOLN
INTRAVENOUS | Status: AC
  Filled 2011-04-15: qty 50

## 2011-04-15 MED ORDER — BUPIVACAINE IN DEXTROSE 0.75-8.25 % IT SOLN
INTRATHECAL | Status: DC | PRN
  Administered 2011-04-15: 12 mg via INTRATHECAL

## 2011-04-15 MED ORDER — BUPIVACAINE-EPINEPHRINE 0.25% -1:200000 IJ SOLN
INTRAMUSCULAR | Status: DC | PRN
  Administered 2011-04-15: 1.8 mL

## 2011-04-15 MED ORDER — MIDAZOLAM HCL 2 MG/2ML IJ SOLN
1.0000 mg | INTRAMUSCULAR | Status: DC | PRN
  Administered 2011-04-15: 2 mg via INTRAVENOUS

## 2011-04-15 MED ORDER — 0.9 % SODIUM CHLORIDE (POUR BTL) OPTIME
TOPICAL | Status: DC | PRN
  Administered 2011-04-15: 1000 mL

## 2011-04-15 MED ORDER — LACTATED RINGERS IV SOLN
INTRAVENOUS | Status: DC
  Administered 2011-04-15 (×2): via INTRAVENOUS

## 2011-04-15 MED ORDER — HYDROMORPHONE HCL PF 1 MG/ML IJ SOLN
0.2500 mg | INTRAMUSCULAR | Status: DC | PRN
  Administered 2011-04-15 (×2): 0.5 mg via INTRAVENOUS

## 2011-04-15 MED ORDER — BUPIVACAINE-EPINEPHRINE PF 0.5-1:200000 % IJ SOLN
INTRAMUSCULAR | Status: DC | PRN
  Administered 2011-04-15: 125 mg

## 2011-04-15 SURGICAL SUPPLY — 58 items
ADH SKN CLS APL DERMABOND .7 (GAUZE/BANDAGES/DRESSINGS) ×1
APL SKNCLS STERI-STRIP NONHPOA (GAUZE/BANDAGES/DRESSINGS)
APPLICATOR COTTON TIP 6IN STRL (MISCELLANEOUS) ×2 IMPLANT
BENZOIN TINCTURE PRP APPL 2/3 (GAUZE/BANDAGES/DRESSINGS) IMPLANT
BLADE SURG 10 STRL SS (BLADE) ×2 IMPLANT
BLADE SURG 15 STRL LF DISP TIS (BLADE) ×1 IMPLANT
BLADE SURG 15 STRL SS (BLADE) ×1
BLADE SURG ROTATE 9660 (MISCELLANEOUS) ×2 IMPLANT
CANISTER SUCTION 2500CC (MISCELLANEOUS) IMPLANT
CHLORAPREP W/TINT 26ML (MISCELLANEOUS) ×2 IMPLANT
CLOTH BEACON ORANGE TIMEOUT ST (SAFETY) ×2 IMPLANT
COVER SURGICAL LIGHT HANDLE (MISCELLANEOUS) ×2 IMPLANT
DERMABOND ADVANCED (GAUZE/BANDAGES/DRESSINGS) ×1
DERMABOND ADVANCED .7 DNX12 (GAUZE/BANDAGES/DRESSINGS) ×1 IMPLANT
DRAIN PENROSE 1/2X12 LTX STRL (WOUND CARE) ×2 IMPLANT
DRAPE LAPAROTOMY TRNSV 102X78 (DRAPE) ×2 IMPLANT
DRAPE UTILITY 15X26 W/TAPE STR (DRAPE) ×4 IMPLANT
DRSG TEGADERM 4X4.75 (GAUZE/BANDAGES/DRESSINGS) IMPLANT
ELECT CAUTERY BLADE 6.4 (BLADE) ×2 IMPLANT
ELECT REM PT RETURN 9FT ADLT (ELECTROSURGICAL) ×2
ELECTRODE REM PT RTRN 9FT ADLT (ELECTROSURGICAL) ×1 IMPLANT
GLOVE BIO SURGEON STRL SZ7.5 (GLOVE) ×2 IMPLANT
GLOVE BIOGEL M STRL SZ7.5 (GLOVE) ×4 IMPLANT
GLOVE BIOGEL PI IND STRL 7.0 (GLOVE) ×1 IMPLANT
GLOVE BIOGEL PI IND STRL 7.5 (GLOVE) ×1 IMPLANT
GLOVE BIOGEL PI IND STRL 8 (GLOVE) ×1 IMPLANT
GLOVE BIOGEL PI INDICATOR 7.0 (GLOVE) ×1
GLOVE BIOGEL PI INDICATOR 7.5 (GLOVE) ×1
GLOVE BIOGEL PI INDICATOR 8 (GLOVE) ×1
GLOVE SURG SS PI 6.5 STRL IVOR (GLOVE) ×2 IMPLANT
GLOVE SURG SS PI 7.0 STRL IVOR (GLOVE) ×2 IMPLANT
GOWN STRL NON-REIN LRG LVL3 (GOWN DISPOSABLE) ×4 IMPLANT
GOWN STRL REIN XL XLG (GOWN DISPOSABLE) ×2 IMPLANT
KIT BASIN OR (CUSTOM PROCEDURE TRAY) ×2 IMPLANT
KIT ROOM TURNOVER OR (KITS) ×2 IMPLANT
MESH ULTRAPRO 3X6 7.6X15CM (Mesh General) ×2 IMPLANT
NEEDLE HYPO 25GX1X1/2 BEV (NEEDLE) ×2 IMPLANT
NS IRRIG 1000ML POUR BTL (IV SOLUTION) ×2 IMPLANT
PACK SURGICAL SETUP 50X90 (CUSTOM PROCEDURE TRAY) ×2 IMPLANT
PAD ARMBOARD 7.5X6 YLW CONV (MISCELLANEOUS) ×4 IMPLANT
PENCIL BUTTON HOLSTER BLD 10FT (ELECTRODE) ×2 IMPLANT
SPECIMEN JAR SMALL (MISCELLANEOUS) IMPLANT
SPONGE GAUZE 4X4 12PLY (GAUZE/BANDAGES/DRESSINGS) IMPLANT
SPONGE INTESTINAL PEANUT (DISPOSABLE) ×2 IMPLANT
SPONGE LAP 18X18 X RAY DECT (DISPOSABLE) ×2 IMPLANT
STRIP CLOSURE SKIN 1/2X4 (GAUZE/BANDAGES/DRESSINGS) ×2 IMPLANT
SUT MNCRL AB 4-0 PS2 18 (SUTURE) ×2 IMPLANT
SUT PROLENE 2 0 CT2 30 (SUTURE) ×6 IMPLANT
SUT VIC AB 2-0 CT1 36 (SUTURE) ×2 IMPLANT
SUT VIC AB 3-0 SH 18 (SUTURE) ×2 IMPLANT
SUT VICRYL AB 3 0 TIES (SUTURE) ×2 IMPLANT
SYR BULB 3OZ (MISCELLANEOUS) ×2 IMPLANT
SYR CONTROL 10ML LL (SYRINGE) ×2 IMPLANT
TOWEL OR 17X24 6PK STRL BLUE (TOWEL DISPOSABLE) ×4 IMPLANT
TOWEL OR 17X26 10 PK STRL BLUE (TOWEL DISPOSABLE) ×2 IMPLANT
TUBE CONNECTING 12X1/4 (SUCTIONS) IMPLANT
WATER STERILE IRR 1000ML POUR (IV SOLUTION) IMPLANT
YANKAUER SUCT BULB TIP NO VENT (SUCTIONS) IMPLANT

## 2011-04-15 NOTE — OR Nursing (Signed)
noraml sensation is currently ~ T12 bilat

## 2011-04-15 NOTE — Transfer of Care (Signed)
Immediate Anesthesia Transfer of Care Note  Patient: Troy Burch  Procedure(s) Performed:  HERNIA REPAIR INGUINAL ADULT - Left inguinal hernia repair with mesh.  Patient Location: PACU  Anesthesia Type: MAC and Regional  Level of Consciousness: awake, alert  and oriented  Airway & Oxygen Therapy: Patient Spontanous Breathing and Patient connected to nasal cannula oxygen  Post-op Assessment: Report given to PACU RN and Post -op Vital signs reviewed and stable  Post vital signs: Reviewed and stable  Complications: No apparent anesthesia complications

## 2011-04-15 NOTE — Progress Notes (Signed)
Report received 

## 2011-04-15 NOTE — Anesthesia Procedure Notes (Addendum)
Anesthesia Regional Block:  TAP block  Pre-Anesthetic Checklist: ,, timeout performed, Correct Patient, Correct Site, Correct Laterality, Correct Procedure,,, risks and benefits discussed,, pre-op evaluation, post-op pain management  Laterality: Left  Prep: chloraprep       Needles:  Injection technique: Single-shot  Needle Type: Echogenic Stimulator Needle          Additional Needles:  Procedures: ultrasound guided TAP block Narrative:  Start time: 04/15/2011 9:42 AM End time: 04/15/2011 9:42 AM Injection made incrementally with aspirations every 5 mL. Anesthesiologist: J. Adonis Huguenin, MD  Additional Notes: Neg. Test dose, pt tolerated well.   Spinal  Patient location during procedure: OR Start time: 04/15/2011 10:11 AM End time: 04/15/2011 10:15 AM Staffing Anesthesiologist: Remonia Richter Performed by: anesthesiologist  Preanesthetic Checklist Completed: patient identified, site marked, surgical consent, pre-op evaluation, timeout performed, IV checked, risks and benefits discussed and monitors and equipment checked Spinal Block Patient position: sitting Prep: Betadine Patient monitoring: heart rate, cardiac monitor, continuous pulse ox and blood pressure Approach: midline Location: L2-3 Injection technique: single-shot Needle Needle type: Quincke  Needle gauge: 25 G Needle length: 9 cm Needle insertion depth: 5 cm

## 2011-04-15 NOTE — Anesthesia Postprocedure Evaluation (Signed)
  Anesthesia Post-op Note  Patient: Troy Burch  Procedure(s) Performed:  HERNIA REPAIR INGUINAL ADULT - Left inguinal hernia repair with mesh.  Patient Location: PACU  Anesthesia Type: Spinal  Level of Consciousness: awake  Airway and Oxygen Therapy: Patient Spontanous Breathing  Post-op Pain: controlled  Post-op Assessment: Post-op Vital signs reviewed  Post-op Vital Signs: stable  Complications: No apparent anesthesia complications

## 2011-04-15 NOTE — Progress Notes (Signed)
Spinal level at L5

## 2011-04-15 NOTE — Op Note (Signed)
Hernia, Open, Repair with Mesh Procedure Note  Indications: The patient presented with a history of a left, reducible hernia.    Pre-operative Diagnosis: left reducible inguinal hernia  Post-operative Diagnosis: left reducible indirect inguinal hernia  Surgeon: Atilano Ina   Assistants: none  Anesthesia: Monitored Local Anesthesia with Sedation, Spinal anesthesia and left TAP block with bupivacaine   ASA Class: 3  Surgeon: Mary Sella. Andrey Campanile, MD, FACS  Procedure Details  The patient was seen again in the Holding Room. The risks, benefits, complications, treatment options, and expected outcomes were discussed with the patient. The possibilities of reaction to medication, pulmonary aspiration, perforation of viscus, bleeding, recurrent infection, the need for additional procedures, and development of a complication requiring transfusion or further operation were discussed with the patient and/or family. The likelihood of success in repairing the hernia and returning the patient to their previous functional status is good.  There was concurrence with the proposed plan, and informed consent was obtained. The site of surgery was properly noted/marked. The patient was taken to the Operating Room, identified as Troy Burch, and the procedure verified as left inguinal hernia repair. A Time Out was held and the above information confirmed.  The patient was sat up on the operating room table and a spinal was performed. The patient was then placed in the supine position and underwent monitored anesthesia care. The lower abdomen and groin was prepped with Chloraprep and draped in the standard fashion, and 0.25% Marcaine with epinephrine was used to anesthetize the skin over the mid-portion of the inguinal canal. An oblique incision was made. Dissection was carried down through the subcutaneous tissue with cautery to the external oblique fascia.  We opened the external oblique fascia along the  direction of its fibers to the external ring.  The spermatic cord was circumferentially dissected bluntly and retracted with a Penrose drain.  The floor of the inguinal canal was inspected and it was intact.  We skeletonized the spermatic cord and the testicular vessels. The nerve was identified and preserved. An indirect hernia sac was identified and reduced back into abdominal cavity. Some preperitoneal fat was stripped away and discarded. .  We used a 3 x 6 inch piece of Ultrapro mesh, which was cut into a keyhole shape.  This was secured with 2-0 Prolene, beginning at the pubic tubercle, running this along the shelving edge inferiorly. Superiorly, the mesh was secured to the internal oblique fascia with interrupted 2-0 Prolene sutures.  The tails of the mesh were sutured together behind the spermatic cord.  The mesh was tucked underneath the external oblique fascia laterally.  The external oblique fascia was reapproximated with 2-0 Vicryl.  3-0 Vicryl was used to close the subcutaneous tissues and 4-0 Monocryl was used to close the skin in subcuticular fashion.  Dermabond was used to seal the incision.  The patient was then  brought to the recovery room in stable condition.  All sponge, instrument, and needle counts were correct prior to closure and at the conclusion of the case.   Estimated Blood Loss: Minimal                 Complications: None; patient tolerated the procedure well.         Disposition: PACU - hemodynamically stable.         Condition: stable  Mary Sella. Andrey Campanile, MD, FACS General, Bariatric, & Minimally Invasive Surgery [facility], [place]

## 2011-04-15 NOTE — Preoperative (Signed)
Beta Blockers   Reason not to administer Beta Blockers:Not Applicable 

## 2011-04-15 NOTE — H&P (Signed)
No chief complaint on file. CC:  Left inguinal hernia  HISTORY: 56 yo WM with several month h/o of left groin bulge referred by his PCP.  I initially saw the patient in December. He states he has had a bulge in his left groin since October. It causes discomfort and burning.  He denies any new medications or trips to the ED or hospital since I saw him in December. He has severe COPD and emphysema and uses inhalers. He is not on O2.  He states he had some greenish sputum last week.  He denies any fevers, chills, nausea, vomiting, diarrhea, constipation, dysuria, chest pain.   Past Medical History  Diagnosis Date  . COPD (chronic obstructive pulmonary disease)   . Emphysema of lung   . Pneumonia     last event September 2012     Past Surgical History  Procedure Date  . Tonsillectomy     Current Facility-Administered Medications  Medication Dose Route Frequency Provider Last Rate Last Dose  . lactated ringers infusion   Intravenous Continuous Remonia Richter, MD 50 mL/hr at 04/15/11 0919       No Known Allergies   No family history on file.   History   Social History  . Marital Status: Married    Spouse Name: Cathlean Cower    Number of Children: Y  . Years of Education: N/A   Occupational History  . unemployed    Social History Main Topics  . Smoking status: Former Smoker -- 2.0 packs/day for 30 years    Types: Cigarettes    Quit date: 11/07/2010  . Smokeless tobacco: Never Used  . Alcohol Use: Yes  . Drug Use: No  . Sexually Active: No   Other Topics Concern  . Not on file   Social History Narrative  . No narrative on file     REVIEW OF SYSTEMS - PERTINENT POSITIVES ONLY: Chronic cough, some wheezing, +DOE, some nocturia, o/w 10 point ROS is negative  EXAM: Filed Vitals:   04/15/11 0801  BP: 110/77  Pulse: 109  Temp: 97.5 F (36.4 C)  Resp: 18  BP 110/77  Pulse 109  Temp(Src) 97.5 F (36.4 C) (Oral)  Resp 18  SpO2 98%   GEN:  WD, thin  appearing, non-toxic, NAD HEENT: normocephalic; pupils equal and reactive; sclerae clear;  mucous membranes moist NECK:  ; symmetric on extension; no palpable anterior or posterior cervical lymphadenopathy; no supraclavicular masses; no tenderness CHEST: clear to auscultation bilaterally without rales, rhonchi, some scattered wheezes CARDIAC: regular rate and rhythm without significant murmur; peripheral pulses are full EXT:  non-tender without edema; no deformity; symmetric strength NEURO: no gross focal deficits; no sign of tremor ABD:  Soft, NT, ND. +left inguinal hernia RECTAL: deferred  LABORATORY RESULTS: See Epic for most recent results  RADIOLOGY RESULTS: See Epic or I-Site for most recent results  DATA REVIEWED: My office note  IMPRESSION: Left inguinal hernia COPD/emphysema   PLAN: Open left inguinal hernia repair with mesh under regional block and MAC b/c of pt's lung disease.   I re-described the procedure in detail.  We discussed the risks and benefits including but not limited to bleeding, infection, chronic inguinal pain, nerve entrapment, hernia recurrence, mesh complications, hematoma formation, urinary retention, injury to the testicles or the ovaries, numbness in the groin, blood clots, injury to the surrounding structures, and anesthesia risk. We also discussed the typical post operative recovery course, including no heavy lifting for 4-6 weeks. I explained that  the likelihood of improvement of their symptoms is good.  His initial preop WBC was elevated at around 16. He had no new symptoms other than his chronic emphysema with sputum. A repeat WBC was trending down to 12.  He has had no fevers.    Will proceed with hernia repair.    Mary Sella. Andrey Campanile, MD, FACS General, Bariatric, & Minimally Invasive Surgery [facility]      Visit Diagnoses: Left inguinal hernia  Primary Care Physician: Cala Bradford, MD, MD

## 2011-04-15 NOTE — Interval H&P Note (Signed)
History and Physical Interval Note:  04/15/2011 9:38 AM  Troy Burch  has presented today for surgery, with the diagnosis of Left inguinal hernia.  The various methods of treatment have been discussed with the patient and family. After consideration of risks, benefits and other options for treatment, the patient has consented to  Procedure(s): HERNIA REPAIR INGUINAL ADULT as a surgical intervention .  The patients' history has been reviewed, patient examined, no change in status, stable for surgery.  I have reviewed the patients' chart and labs.  Questions were answered to the patient's satisfaction.    Mary Sella. Andrey Campanile, MD, FACS General, Bariatric, & Minimally Invasive Surgery [facility], [place]   [facility]

## 2011-04-16 ENCOUNTER — Encounter (HOSPITAL_COMMUNITY): Payer: Self-pay | Admitting: General Surgery

## 2011-04-22 ENCOUNTER — Telehealth: Payer: Self-pay | Admitting: Pulmonary Disease

## 2011-04-22 NOTE — Telephone Encounter (Signed)
Spoke to pt's wife and she states pt is refusing to come in for the cough he is having, but she does want to reschedule his appt with kc that got cancelled due to kc being out of town--pt's appt rsch for 2/27 @ 2:15--per wife's request--wife will call back if pt wants appt with someone else here for his cough--but most likely will see his primary md instead

## 2011-04-23 ENCOUNTER — Ambulatory Visit: Payer: Self-pay | Admitting: Pulmonary Disease

## 2011-05-11 ENCOUNTER — Telehealth: Payer: Self-pay | Admitting: Pulmonary Disease

## 2011-05-11 NOTE — Telephone Encounter (Signed)
Last ov with KC 11.28.12, follow up in 3 months.  Pt has cancelled last appts 2.8.13 and 2.27.13.  No pending appts at this time.  LMOM TCB x1.

## 2011-05-11 NOTE — Telephone Encounter (Signed)
Spoke with pt's spouse. She states that the pt has no appetite, has been loosing wt unintentionally and his cough continues to worsen. I advised that he has cancelled his past 2 appts and if doing this poorly ov is best rather than just calling something in.  She agrees and has rescheduled the appt for tomorrow at 9:45 am.

## 2011-05-12 ENCOUNTER — Ambulatory Visit (INDEPENDENT_AMBULATORY_CARE_PROVIDER_SITE_OTHER): Payer: Self-pay | Admitting: Pulmonary Disease

## 2011-05-12 ENCOUNTER — Encounter: Payer: Self-pay | Admitting: Pulmonary Disease

## 2011-05-12 ENCOUNTER — Ambulatory Visit: Payer: Self-pay | Admitting: Pulmonary Disease

## 2011-05-12 DIAGNOSIS — J984 Other disorders of lung: Secondary | ICD-10-CM

## 2011-05-12 DIAGNOSIS — J449 Chronic obstructive pulmonary disease, unspecified: Secondary | ICD-10-CM

## 2011-05-12 NOTE — Assessment & Plan Note (Signed)
The patient feels that he was breathing better on Spiriva, however did not qualify for the patient assistance program.  Will therefore try him on symbicort, and see if he can qualify for their program.

## 2011-05-12 NOTE — Progress Notes (Signed)
  Subjective:    Patient ID: Troy Burch, male    DOB: 06-Jan-1956, 56 y.o.   MRN: [id number]  HPI The patient comes in today for followup of his known COPD and also chronic upper lobe cavitary disease.  Overall, he has been stable since the last visit, but continues to cough up purulent-appearing mucus at times.  He feels Spiriva has helped his breathing, but did not qualify for the patient assistance program.  He is not able to afford it.  His main complaint today is that of 2 different coughs.  His cough during the day he is wet and produces mucus.  He has severe coughing spells because of its severe difficulty with mucociliary clearance.  He has not tried a flutter valve.  His other cough occurs primarily at night, especially when lying down, and can awaken him from sleep.  This is a dry hacking cough, and he does note significant postnasal drip.  He denies significant reflux symptoms.  He continues to have dyspnea on exertion, but it is at baseline.  He has lost a little bit of weight since the last visit, but had recent hernia surgery with GI difficulties associated with chronic narcotic use.   Review of Systems  Constitutional: Positive for appetite change and unexpected weight change. Negative for fever.  HENT: Positive for rhinorrhea. Negative for ear pain, nosebleeds, congestion, sore throat, sneezing, trouble swallowing, dental problem, postnasal drip and sinus pressure.   Eyes: Positive for itching. Negative for redness.  Respiratory: Positive for cough, shortness of breath and wheezing. Negative for chest tightness.   Cardiovascular: Negative for palpitations and leg swelling.  Gastrointestinal: Negative for nausea and vomiting.  Genitourinary: Negative for dysuria.  Musculoskeletal: Negative for joint swelling.  Skin: Negative for rash.  Neurological: Negative for headaches.  Hematological: Bruises/bleeds easily.  Psychiatric/Behavioral: Negative for dysphoric mood. The patient  is not nervous/anxious.        Objective:   Physical Exam Thin male in no acute distress Nose with purulent discharge noted Chest with mildly decreased breath sounds, rhonchi throughout, bronchial breath sounds in the upper lung zones. Cardiac exam with regular rate and rhythm Lower extremities without edema, no cyanosis noted Alert and oriented, moves all 4 extremities.       Assessment & Plan:

## 2011-05-12 NOTE — Assessment & Plan Note (Addendum)
The pt is still coughing up purulent appearing mucus, and is having difficulty with [facility].  He may benefit from a flutter valve.  His last sputum culture was in November of last year, but he had been on long-standing antibiotics beforehand.  I wanted to culture him today, however he has taken leftover doxycycline for the last 3 days.  I will therefore wait 2 weeks and he is to bring in sputum for AFB and routine culture.  I think it is really important to identify his colonizing flora, so that we can choose his antibiotic therapy properly with flareups.  At some point he may need bronchoscopy to try and identify his colonizing flora.

## 2011-05-12 NOTE — Patient Instructions (Addendum)
Will change spiriva to symbicort 160/4.5 2 inhalations am and pm everyday.  Rinse mouth out well.  Will try to get you on their patient assistance program.  Will check a culture of your sputum in 2 weeks.  Please bring first thing in the am after coughing up. Push calories as much as possible. Use flutter valve am and pm to help clear mucus Try chlorpheniramine 8mg  at bedtime for nasal drip, and can take second dose at lunch if needed.  followup with me in 4mos

## 2011-05-19 ENCOUNTER — Encounter (INDEPENDENT_AMBULATORY_CARE_PROVIDER_SITE_OTHER): Payer: Self-pay | Admitting: General Surgery

## 2011-09-08 ENCOUNTER — Ambulatory Visit: Payer: Self-pay | Admitting: Pulmonary Disease

## 2013-04-04 ENCOUNTER — Other Ambulatory Visit: Payer: Self-pay | Admitting: Family Medicine

## 2013-04-04 ENCOUNTER — Ambulatory Visit
Admission: RE | Admit: 2013-04-04 | Discharge: 2013-04-04 | Disposition: A | Discharge status: Home / Self Care | Payer: No Typology Code available for payment source | Source: Ambulatory Visit | Attending: Family Medicine | Admitting: Family Medicine

## 2013-04-04 DIAGNOSIS — Z006 Encounter for examination for normal comparison and control in clinical research program: Secondary | ICD-10-CM

## 2015-01-25 DIAGNOSIS — Z23 Encounter for immunization: Secondary | ICD-10-CM | POA: Diagnosis not present

## 2016-01-23 DIAGNOSIS — Z23 Encounter for immunization: Secondary | ICD-10-CM | POA: Diagnosis not present

## 2016-02-23 ENCOUNTER — Other Ambulatory Visit: Payer: Self-pay | Admitting: Physician Assistant

## 2016-02-23 ENCOUNTER — Ambulatory Visit
Admission: RE | Admit: 2016-02-23 | Discharge: 2016-02-23 | Disposition: A | Discharge status: Home / Self Care | Payer: Medicare Other | Source: Ambulatory Visit | Attending: Physician Assistant | Admitting: Physician Assistant

## 2016-02-23 DIAGNOSIS — M25562 Pain in left knee: Secondary | ICD-10-CM

## 2016-02-23 DIAGNOSIS — Z1389 Encounter for screening for other disorder: Secondary | ICD-10-CM | POA: Diagnosis not present

## 2016-02-23 DIAGNOSIS — J449 Chronic obstructive pulmonary disease, unspecified: Secondary | ICD-10-CM | POA: Diagnosis not present

## 2016-02-23 DIAGNOSIS — Z0000 Encounter for general adult medical examination without abnormal findings: Secondary | ICD-10-CM | POA: Diagnosis not present

## 2016-02-23 DIAGNOSIS — Z23 Encounter for immunization: Secondary | ICD-10-CM | POA: Diagnosis not present

## 2016-02-23 DIAGNOSIS — E291 Testicular hypofunction: Secondary | ICD-10-CM | POA: Diagnosis not present

## 2016-02-23 DIAGNOSIS — Z136 Encounter for screening for cardiovascular disorders: Secondary | ICD-10-CM | POA: Diagnosis not present

## 2016-02-23 DIAGNOSIS — Z125 Encounter for screening for malignant neoplasm of prostate: Secondary | ICD-10-CM | POA: Diagnosis not present

## 2016-08-23 DIAGNOSIS — J449 Chronic obstructive pulmonary disease, unspecified: Secondary | ICD-10-CM | POA: Diagnosis not present

## 2016-08-23 DIAGNOSIS — F419 Anxiety disorder, unspecified: Secondary | ICD-10-CM | POA: Diagnosis not present

## 2016-10-04 DIAGNOSIS — J449 Chronic obstructive pulmonary disease, unspecified: Secondary | ICD-10-CM | POA: Diagnosis not present

## 2016-10-04 DIAGNOSIS — F419 Anxiety disorder, unspecified: Secondary | ICD-10-CM | POA: Diagnosis not present

## 2016-12-25 DIAGNOSIS — Z23 Encounter for immunization: Secondary | ICD-10-CM | POA: Diagnosis not present

## 2017-04-06 DIAGNOSIS — J449 Chronic obstructive pulmonary disease, unspecified: Secondary | ICD-10-CM | POA: Diagnosis not present

## 2017-04-06 DIAGNOSIS — F419 Anxiety disorder, unspecified: Secondary | ICD-10-CM | POA: Diagnosis not present

## 2017-04-06 DIAGNOSIS — E291 Testicular hypofunction: Secondary | ICD-10-CM | POA: Diagnosis not present

## 2017-04-06 DIAGNOSIS — D72829 Elevated white blood cell count, unspecified: Secondary | ICD-10-CM | POA: Diagnosis not present

## 2017-04-06 DIAGNOSIS — R972 Elevated prostate specific antigen [PSA]: Secondary | ICD-10-CM | POA: Diagnosis not present

## 2017-04-28 ENCOUNTER — Ambulatory Visit (INDEPENDENT_AMBULATORY_CARE_PROVIDER_SITE_OTHER): Payer: Self-pay

## 2017-04-28 ENCOUNTER — Encounter (INDEPENDENT_AMBULATORY_CARE_PROVIDER_SITE_OTHER): Payer: Self-pay | Admitting: Orthopaedic Surgery

## 2017-04-28 ENCOUNTER — Ambulatory Visit (INDEPENDENT_AMBULATORY_CARE_PROVIDER_SITE_OTHER): Payer: Medicare Other | Admitting: Orthopaedic Surgery

## 2017-04-28 VITALS — BP 119/76 | HR 77 | Ht 70.0 in | Wt 158.0 lb

## 2017-04-28 DIAGNOSIS — R0781 Pleurodynia: Secondary | ICD-10-CM | POA: Diagnosis not present

## 2017-04-28 DIAGNOSIS — M25571 Pain in right ankle and joints of right foot: Secondary | ICD-10-CM

## 2017-04-28 DIAGNOSIS — S8264XA Nondisplaced fracture of lateral malleolus of right fibula, initial encounter for closed fracture: Secondary | ICD-10-CM

## 2017-04-28 DIAGNOSIS — M79671 Pain in right foot: Secondary | ICD-10-CM

## 2017-04-28 NOTE — Progress Notes (Signed)
Office Visit Note   Patient: Troy Burch           Date of Birth: December 17, 1955           MRN: [id number] Visit Date: 04/28/2017              Requested by: Harlan Stains, MD [address] [place], [place] [address] PCP: Harlan Stains, MD   Assessment & Plan: Visit Diagnoses:  1. Right foot pain   2. Pain in right ankle and joints of right foot   3. Rib pain on left side   4. Closed nondisplaced fracture of lateral malleolus of right fibula, initial encounter     Plan:  #1: Equalizer boot to the right foot and ankle.  He is to wear this for 23 hours a day.  Off for bathing and skin checking. #2: Follow back up with [place] in 2 weeks for recheck x-ray of the ankle looking for displacement of the fracture. #3: Discussed with family and him about obtaining a pulmonologist to evaluate his chest especially with recent chest x-ray changes  Follow-Up Instructions: Return in about 2 weeks (around 05/12/2017).   Face-to-face time spent with patient was greater than 30 minutes.  Greater than 50% of the time was spent in counseling and coordination of care.  We had a long discussion with him also the fact that with his chest x-ray changes that he needs to be seen by a pulmonologist.  Orders:  Orders Placed This Encounter  Procedures  . XR Foot Complete Right  . XR Ankle Complete Right  . XR Ribs Unilateral Left  . XR Chest 2 View   No orders of the defined types were placed in this encounter.     Procedures: No procedures performed   Clinical Data: No additional findings.   Subjective: Chief Complaint  Patient presents with  . Right Foot - Pain  . Right Ankle - Pain  . Chest - Pain    HPI  Troy Burch is a 62 year old white male who is seen today for multiple problems.  Apparently 9 days ago he was up on a work bench hanging a large speaker in his shop when apparently as he was trying to wire the speaker it moved and he went to fall and he fell off ladder.  He  came down onto his right lower extremity and sustained an injury to the right ankle and foot.  He did have alcohol on board.  He does have emphysema.  He believed it would get better without any type of medical evaluation however now it is continued to be painful.  He was also noted to have marked ecchymosis around the area of the ankle.  This unfortunately has not resolved so therefore he is proceeded to come in today for evaluation.  In addition, he is not sure but he has developed pain in the anterior portion of the chest.  This is near his second and third rib and juxtaposition to the sternum.  He does have tenderness to palpation of this area.  When I do compress he does have some pain also in the shoulder blade area.  He states that when he does cough secondary to his emphysema it is quite symptomatic.  He is not really sure whether he has more dyspnea than usual.  Review of Systems  Constitutional: Negative.   HENT: Positive for hearing loss.   Eyes: Negative.   Respiratory: Positive for cough, shortness of breath and  wheezing.   Cardiovascular: Negative for chest pain and leg swelling.  Gastrointestinal: Negative.   Endocrine: Positive for cold intolerance. Negative for polydipsia, polyphagia and polyuria.  Genitourinary: Negative for difficulty urinating, dysuria and frequency.  Musculoskeletal: Positive for arthralgias and back pain.  Skin: Negative for color change.  Allergic/Immunologic: Negative.   Neurological: Negative.   Psychiatric/Behavioral: Negative for hallucinations. The patient is nervous/anxious.      Objective: Vital Signs: There were no vitals taken for this visit.  Physical Exam  Constitutional: He is oriented to person, place, and time. He appears well-developed and well-nourished.  HENT:  Head: Normocephalic and atraumatic.  Eyes: EOM are normal. Pupils are equal, round, and reactive to light.  Pulmonary/Chest: Effort normal.  Neurological: He is alert and  oriented to person, place, and time.  Skin: Skin is warm and dry.  Psychiatric: He has a normal mood and affect. His behavior is normal. Judgment and thought content normal.    Ortho Exam  The right foot today reveals tenderness palpation over the midfoot more along the lateral aspect of the third fourth and fifth metatarsals.  He does have pain to palpation over the distal fibula as well as over the collateral ligaments of the lateral ankle.  Not much in the way medial pain though he does have some tenderness at the deltoid.  Diffuse ecchymosis about the ankle and foot.  Flexor extensors are intact.  He does have some pain to palpation over the anterior lateral chest wall mainly just lateral to the mediastinum.  With compression anterior posterior does have pain referred to the posterior aspect of the thorax also.  I do not see any ecchymotic areas.  Good motion of the left shoulder and neck without difficulties.   Specialty Comments:  No specialty comments available.  Imaging: Xr Chest 2 View  Result Date: 04/28/2017 Chest x-ray reviewed and discussed with the radiologist at [place] reveals consolidations in the upper lobes.  Question of whether this might have some fungal type material noted.  Previous x-rays 2013 revealed cysts in the upper lobes but this is now more consolidated.  Felt that pulmonologist should be seeing him and evaluating this.  Xr Ankle Complete Right  Result Date: 04/28/2017 Three-view x-ray of the right ankle reveals a spiral fracture of the distal fibula Weber B with minimal displacement.  Dr. which she will review this film also.  Does have some notching at the talar neck.  Consistent with possible impingement.  Possible small avulsion fracture at the tip of the medial malleolus.  This is nondisplaced.  Xr Foot Complete Right  Result Date: 04/28/2017 Three-view x-ray of the right foot reveals significant DJD first MTP joint.  No obvious fracture was  noted.  Xr Ribs Unilateral Left  Result Date: 04/28/2017 X-ray reviewed by radiologist at [facility] and Dr. Durward Fortes and myself does not reveal any occult rib fracture.    PMFS History: Patient Active Problem List   Diagnosis Date Noted  . COPD (chronic obstructive pulmonary disease) ([place]) 12/16/2010  . Cavitary lung disease 11/20/2010   Past Medical History:  Diagnosis Date  . COPD (chronic obstructive pulmonary disease) ([place])   . Emphysema of lung ([place])   . Osteoarthritis   . Pneumonia    last event September 2012    No family history on file.  Past Surgical History:  Procedure Laterality Date  . INGUINAL HERNIA REPAIR  04/15/2011   Procedure: HERNIA REPAIR INGUINAL ADULT;  Surgeon: Gayland Curry, MD;  Location: [facility] OR;  Service: General;  Laterality: Left;  Left inguinal hernia repair with mesh.  . TONSILLECTOMY     Social History   Occupational History  . Occupation: unemployed  Tobacco Use  . Smoking status: Former Smoker    Packs/day: 2.00    Years: 30.00    Pack years: 60.00    Types: Cigarettes    Last attempt to quit: 11/07/2010    Years since quitting: 6.4  . Smokeless tobacco: Never Used  Substance and Sexual Activity  . Alcohol use: Yes    Alcohol/week: 3.6 oz    Types: 6 Cans of beer per week  . Drug use: No  . Sexual activity: No

## 2017-05-12 DIAGNOSIS — N5201 Erectile dysfunction due to arterial insufficiency: Secondary | ICD-10-CM | POA: Diagnosis not present

## 2017-05-12 DIAGNOSIS — R3915 Urgency of urination: Secondary | ICD-10-CM | POA: Diagnosis not present

## 2017-05-12 DIAGNOSIS — R972 Elevated prostate specific antigen [PSA]: Secondary | ICD-10-CM | POA: Diagnosis not present

## 2017-05-13 ENCOUNTER — Encounter (INDEPENDENT_AMBULATORY_CARE_PROVIDER_SITE_OTHER): Payer: Self-pay | Admitting: Orthopaedic Surgery

## 2017-05-13 ENCOUNTER — Ambulatory Visit (INDEPENDENT_AMBULATORY_CARE_PROVIDER_SITE_OTHER): Payer: Medicare Other

## 2017-05-13 ENCOUNTER — Ambulatory Visit (INDEPENDENT_AMBULATORY_CARE_PROVIDER_SITE_OTHER): Payer: Medicare Other | Admitting: Orthopaedic Surgery

## 2017-05-13 VITALS — Ht 70.0 in | Wt 156.0 lb

## 2017-05-13 DIAGNOSIS — M25571 Pain in right ankle and joints of right foot: Secondary | ICD-10-CM

## 2017-05-13 NOTE — Progress Notes (Signed)
Office Visit Note   Patient: Troy Burch           Date of Birth: Jul 16, 1955           MRN: [id number] Visit Date: 05/13/2017              Requested by: Harlan Stains, MD [address] [place], [place] [address] PCP: Harlan Stains, MD   Assessment & Plan: Visit Diagnoses:  1. Pain in right ankle and joints of right foot     Plan: 3-1/2 weeks status post essentially nondisplaced right distal fibula fracture. Doing well in equalizer boot. Still having some pain but "less" and films demonstrate no change in position of the fracture. We'll continue with the equalizer boot and limited weightbearing for 3 more weeks. Return at that point repeat films. Patient is to make an appointment to see his pulmonologist for this chronic emphysema  Follow-Up Instructions: Return in about 3 weeks (around 06/03/2017).   Orders:  Orders Placed This Encounter  Procedures  . XR Ankle Complete Right   No orders of the defined types were placed in this encounter.     Procedures: No procedures performed   Clinical Data: No additional findings.   Subjective: Chief Complaint  Patient presents with  . Right Ankle - Fracture    Mr. Arduini is a 62 y o S/P Closed nondisplaced fracture of lateral malleolus of right fibula,     3-1/2 weeks status post injury to right ankle. Prior films demonstrated nondisplaced fracture of right distal fibula. Still having some pain but "better".  HPI  Review of Systems  Constitutional: Negative for fatigue.  HENT: Positive for hearing loss.   Respiratory: Positive for cough, chest tightness and shortness of breath. Negative for apnea.   Cardiovascular: Negative for palpitations and leg swelling.  Gastrointestinal: Negative for blood in stool, constipation and diarrhea.  Genitourinary: Negative for difficulty urinating.  Musculoskeletal: Negative for arthralgias, back pain, joint swelling, myalgias, neck pain and neck stiffness.    Neurological: Negative for weakness, numbness and headaches.  Hematological: Does not bruise/bleed easily.  Psychiatric/Behavioral: Negative for sleep disturbance. The patient is not nervous/anxious.      Objective: Vital Signs: Ht 5\' 10"  (1.778 m)   Wt 156 lb (70.8 kg)   BMI 22.38 kg/m   Physical Exam  Ortho Exam awake alert and oriented 3. Comfortable sitting. Examination of the right ankle out of the boot demonstrate some tenderness over the distal fibula. Minimal swelling. Skin intact. Neurovascular exam intact. No obvious deformity. Able to dorsiflex probably 15 and plantar flex about the same. No crepitation. Not short of breath.   Specialty Comments:  No specialty comments available.  Imaging: Xr Ankle Complete Right  Result Date: 05/13/2017 Films of the right ankle obtained in several projections and compared to those that were performed on February 14. There is no change in position of the distal fibula fracture, mortise is intact. There is no significant displacement.    PMFS History: Patient Active Problem List   Diagnosis Date Noted  . COPD (chronic obstructive pulmonary disease) ([place]) 12/16/2010  . Cavitary lung disease 11/20/2010   Past Medical History:  Diagnosis Date  . COPD (chronic obstructive pulmonary disease) ([place])   . Emphysema of lung ([place])   . Osteoarthritis   . Pneumonia    last event September 2012    History reviewed. No pertinent family history.  Past Surgical History:  Procedure Laterality Date  . INGUINAL HERNIA REPAIR  04/15/2011   Procedure: HERNIA REPAIR INGUINAL ADULT;  Surgeon: Gayland Curry, MD;  Location: [place];  Service: General;  Laterality: Left;  Left inguinal hernia repair with mesh.  . TONSILLECTOMY     Social History   Occupational History  . Occupation: unemployed  Tobacco Use  . Smoking status: Former Smoker    Packs/day: 2.00    Years: 30.00    Pack years: 60.00    Types: Cigarettes    Last attempt to quit:  11/07/2010    Years since quitting: 6.5  . Smokeless tobacco: Never Used  Substance and Sexual Activity  . Alcohol use: Yes    Alcohol/week: 3.6 oz    Types: 6 Cans of beer per week  . Drug use: No  . Sexual activity: No

## 2017-06-03 ENCOUNTER — Encounter (INDEPENDENT_AMBULATORY_CARE_PROVIDER_SITE_OTHER): Payer: Self-pay | Admitting: Orthopaedic Surgery

## 2017-06-03 ENCOUNTER — Ambulatory Visit (INDEPENDENT_AMBULATORY_CARE_PROVIDER_SITE_OTHER): Payer: Medicare Other

## 2017-06-03 ENCOUNTER — Ambulatory Visit (INDEPENDENT_AMBULATORY_CARE_PROVIDER_SITE_OTHER): Payer: Medicare Other | Admitting: Orthopaedic Surgery

## 2017-06-03 VITALS — BP 147/88 | HR 86 | Resp 16 | Ht 70.0 in | Wt 160.0 lb

## 2017-06-03 DIAGNOSIS — M25571 Pain in right ankle and joints of right foot: Secondary | ICD-10-CM

## 2017-06-03 NOTE — Progress Notes (Signed)
Office Visit Note   Patient: Troy Burch           Date of Birth: June 04, 1955           MRN: [id number] Visit Date: 06/03/2017              Requested by: Harlan Stains, MD [address] [place], [place] [address] PCP: Harlan Stains, MD   Assessment & Plan: Visit Diagnoses:  1. Pain in right ankle and joints of right foot     Plan: 6-1/2 weeks status post nondisplaced.  Wearing equalizer boot and.  Discomfort.  Films reveal excellent position of the fracture with apparent healing.  We will discontinue the equalizer boot discussed activity modification until he has no pain.  We will see him back on an as needed  basis  Follow-Up Instructions: Return if symptoms worsen or fail to improve.   Orders:  Orders Placed This Encounter  Procedures  . XR Ankle Complete Right   No orders of the defined types were placed in this encounter.     Procedures: No procedures performed   Clinical Data: No additional findings.   Subjective: Chief Complaint  Patient presents with  . Left Ankle - Follow-up    Here for fu of right ankle, not having any issues. Using cam walker boot    HPI  Review of Systems  Constitutional: Negative for fatigue and fever.  HENT: Negative for ear pain.   Eyes: Negative for pain.  Respiratory: Positive for cough and shortness of breath.   Cardiovascular: Negative for leg swelling.  Gastrointestinal: Negative for constipation and diarrhea.  Genitourinary: Negative for dysuria.  Musculoskeletal: Positive for back pain. Negative for neck pain.  Skin: Negative for rash and wound.  Allergic/Immunologic: Negative for food allergies.  Neurological: Negative for dizziness, weakness, light-headedness, numbness and headaches.  Hematological: Bruises/bleeds easily.  Psychiatric/Behavioral: Negative for sleep disturbance.     Objective: Vital Signs: BP (!) 147/88 (BP Location: Left Arm, Patient Position: Sitting, Cuff Size: Normal)    Pulse 86   Resp 16   Ht 5\' 10"  (1.778 m)   Wt 160 lb (72.6 kg)   BMI 22.96 kg/m   Physical Exam  Ortho Exam awake alert and oriented x3.  Comfortable sitting.  Examination of the right ankle reveal little if any tenderness along the fibula.  Skin intact.  Neurovascular exam intact.  Good capillary refill to toes.  No pain medially.  No ankle instability  Specialty Comments:  No specialty comments available.  Imaging: Xr Ankle Complete Right  Result Date: 06/03/2017 Films of the right ankle were obtained in 3 projections.  The previously identified distal fibula fracture changed in appearance or position.  Ankle mortise intact.  Appears to have some callus about the fracture.    PMFS History: Patient Active Problem List   Diagnosis Date Noted  . COPD (chronic obstructive pulmonary disease) ([place]) 12/16/2010  . Cavitary lung disease 11/20/2010   Past Medical History:  Diagnosis Date  . COPD (chronic obstructive pulmonary disease) ([place])   . Emphysema of lung ([place])   . Osteoarthritis   . Pneumonia    last event September 2012    History reviewed. No pertinent family history.  Past Surgical History:  Procedure Laterality Date  . INGUINAL HERNIA REPAIR  04/15/2011   Procedure: HERNIA REPAIR INGUINAL ADULT;  Surgeon: Gayland Curry, MD;  Location: [facility];  Service: General;  Laterality: Left;  Left inguinal hernia repair with mesh.  Marland Kitchen  TONSILLECTOMY     Social History   Occupational History  . Occupation: unemployed  Tobacco Use  . Smoking status: Former Smoker    Packs/day: 2.00    Years: 30.00    Pack years: 60.00    Types: Cigarettes    Last attempt to quit: 11/07/2010    Years since quitting: 6.5  . Smokeless tobacco: Never Used  Substance and Sexual Activity  . Alcohol use: Yes    Alcohol/week: 3.6 oz    Types: 6 Cans of beer per week  . Drug use: No  . Sexual activity: Never

## 2017-06-09 DIAGNOSIS — R972 Elevated prostate specific antigen [PSA]: Secondary | ICD-10-CM | POA: Diagnosis not present

## 2017-06-09 DIAGNOSIS — D075 Carcinoma in situ of prostate: Secondary | ICD-10-CM | POA: Diagnosis not present

## 2017-06-09 DIAGNOSIS — C61 Malignant neoplasm of prostate: Secondary | ICD-10-CM | POA: Diagnosis not present

## 2017-07-19 DIAGNOSIS — N5201 Erectile dysfunction due to arterial insufficiency: Secondary | ICD-10-CM | POA: Diagnosis not present

## 2017-07-19 DIAGNOSIS — R3915 Urgency of urination: Secondary | ICD-10-CM | POA: Diagnosis not present

## 2017-07-19 DIAGNOSIS — C61 Malignant neoplasm of prostate: Secondary | ICD-10-CM | POA: Diagnosis not present

## 2017-07-20 ENCOUNTER — Encounter: Payer: Self-pay | Admitting: Radiation Oncology

## 2017-07-26 ENCOUNTER — Encounter: Payer: Self-pay | Admitting: Radiation Oncology

## 2017-08-10 ENCOUNTER — Ambulatory Visit: Payer: Medicare Other | Admitting: Radiation Oncology

## 2017-08-19 ENCOUNTER — Encounter: Payer: Self-pay | Admitting: Radiation Oncology

## 2017-08-19 DIAGNOSIS — C61 Malignant neoplasm of prostate: Secondary | ICD-10-CM | POA: Diagnosis not present

## 2017-08-19 DIAGNOSIS — Z136 Encounter for screening for cardiovascular disorders: Secondary | ICD-10-CM | POA: Diagnosis not present

## 2017-08-19 DIAGNOSIS — Z0000 Encounter for general adult medical examination without abnormal findings: Secondary | ICD-10-CM | POA: Diagnosis not present

## 2017-08-19 DIAGNOSIS — D72829 Elevated white blood cell count, unspecified: Secondary | ICD-10-CM | POA: Diagnosis not present

## 2017-08-19 DIAGNOSIS — E785 Hyperlipidemia, unspecified: Secondary | ICD-10-CM | POA: Diagnosis not present

## 2017-08-19 DIAGNOSIS — J449 Chronic obstructive pulmonary disease, unspecified: Secondary | ICD-10-CM | POA: Diagnosis not present

## 2017-08-19 NOTE — Progress Notes (Addendum)
GU Location of Tumor / Histology: prostatic adenocarcinoma  If Prostate Cancer, Gleason Score is (3 + 4) and PSA is (6.5). Prostate volume: 25 cc.  Troy Burch was referred to Dr. Tresa Moore by Maude Leriche, PA-C at [facility] for further evaluation of a rising PSA.  04/2017  PSA 6.51 02/2016 PSA 5.4  Biopsies of prostate (if applicable) revealed:    Past/Anticipated interventions by urology, if any: prostate biopsy, discussion about prostatectomy, referral to radiation oncology to discussion brachytherapy  Past/Anticipated interventions by medical oncology, if any: no  Weight changes, if any: no  Bowel/Bladder complaints, if any: modest bother from irritative symptoms with urgency/frequency that has been slowly progressive. Denies incontinence. Denies dysuria or hematuria. Reports occasional leakage.  Nausea/Vomiting, if any: no  Pain issues, if any:  no  SAFETY ISSUES:  Prior radiation? no  Pacemaker/ICD? no  Possible current pregnancy? no  Is the patient on methotrexate? no  Current Complaints / other details:  62 year old male. Married with one daughter and two sons. Disabled. Has smoked since 2012. Resides in [place] with wife and several dogs. Enjoys working on cars. Emphysema limits his activity. Maternal uncle recently passed of pancreatic ca.   Brachytherapy vs. Surgery

## 2017-08-22 ENCOUNTER — Ambulatory Visit
Admission: RE | Admit: 2017-08-22 | Discharge: 2017-08-22 | Disposition: A | Discharge status: Home / Self Care | Payer: Medicare Other | Source: Ambulatory Visit | Attending: Radiation Oncology | Admitting: Radiation Oncology

## 2017-08-22 ENCOUNTER — Encounter: Payer: Self-pay | Admitting: Radiation Oncology

## 2017-08-22 ENCOUNTER — Other Ambulatory Visit: Payer: Self-pay

## 2017-08-22 ENCOUNTER — Encounter: Payer: Self-pay | Admitting: Medical Oncology

## 2017-08-22 VITALS — BP 124/84 | HR 81 | Temp 97.7°F | Resp 24 | Wt 158.2 lb

## 2017-08-22 DIAGNOSIS — R350 Frequency of micturition: Secondary | ICD-10-CM | POA: Insufficient documentation

## 2017-08-22 DIAGNOSIS — R3915 Urgency of urination: Secondary | ICD-10-CM | POA: Diagnosis not present

## 2017-08-22 DIAGNOSIS — Z87891 Personal history of nicotine dependence: Secondary | ICD-10-CM | POA: Insufficient documentation

## 2017-08-22 DIAGNOSIS — J439 Emphysema, unspecified: Secondary | ICD-10-CM | POA: Insufficient documentation

## 2017-08-22 DIAGNOSIS — Z803 Family history of malignant neoplasm of breast: Secondary | ICD-10-CM | POA: Diagnosis not present

## 2017-08-22 DIAGNOSIS — J449 Chronic obstructive pulmonary disease, unspecified: Secondary | ICD-10-CM | POA: Diagnosis not present

## 2017-08-22 DIAGNOSIS — C61 Malignant neoplasm of prostate: Secondary | ICD-10-CM | POA: Diagnosis not present

## 2017-08-22 DIAGNOSIS — Z79899 Other long term (current) drug therapy: Secondary | ICD-10-CM | POA: Diagnosis not present

## 2017-08-22 DIAGNOSIS — M1990 Unspecified osteoarthritis, unspecified site: Secondary | ICD-10-CM | POA: Insufficient documentation

## 2017-08-22 DIAGNOSIS — R972 Elevated prostate specific antigen [PSA]: Secondary | ICD-10-CM | POA: Diagnosis not present

## 2017-08-22 DIAGNOSIS — Z8042 Family history of malignant neoplasm of prostate: Secondary | ICD-10-CM | POA: Diagnosis not present

## 2017-08-22 HISTORY — DX: Malignant neoplasm of prostate: C61

## 2017-08-22 NOTE — Progress Notes (Signed)
Introduced myself to Troy Burch and his wife Troy Burch as the prostate nurse navigator and my role. He is interested in brachytherapy but has not made a final decision. He voiced concerns about anesthesia due to COPD. He is aware he will be evaluated by anesthesia before procedure. I will continue to follow and asked them to call me with questions or concerns.

## 2017-08-22 NOTE — Progress Notes (Signed)
See progress note under physician encounter. 

## 2017-08-22 NOTE — Progress Notes (Signed)
Radiation Oncology         [id number]) [phone] ________________________________  Initial outpatient Consultation  Name: LOGIN MUCKLEROY MRN: [id number]  Date: 08/22/2017  DOB: 10/13/1955  CC:Troy Stains, MD  Troy Frock, MD   REFERRING PHYSICIAN: Alexis Frock, MD  DIAGNOSIS: 62 y.o. 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 Malignant neoplasm of prostate ([place]) C61     HISTORY OF PRESENT ILLNESS: Troy Burch is a 62 y.o. male with a diagnosis of prostate cancer. He was noted to have an elevated PSA of 6.5 by his primary care physician, Troy Leriche, PA-C.  Accordingly, he was referred for evaluation in urology by Dr. Tresa Burch on 05/12/2017,  digital rectal examination was performed at that time revealing a prostate with right sided nodule > 1/2 lobe.  The patient proceeded to transrectal ultrasound with 12 biopsies of the prostate on 06/09/2017.  The prostate volume measured 25 cc.  Out of 12 core biopsies, 5 were positive.  The maximum Gleason score was 4+3=7, and this was seen in the left mid lateral. The patient reviewed the biopsy results with his urologist and he has kindly been referred today for discussion of potential radiation treatment options.  On review of systems, the patient does endorse modest irritative symptoms with urgency and frequency that is slowly progressing. He also reports occasional leakage. He endorses SOB but attributes this to COPD. He reports his grandmother died of breast cancer and his maternal uncle died of pancreatic cancer.   PREVIOUS RADIATION THERAPY: No  PAST MEDICAL HISTORY:  Past Medical History:  Diagnosis Date  . COPD (chronic obstructive pulmonary disease) ([place])   . Emphysema of lung ([place])   . Osteoarthritis   . Pneumonia    last event September 2012  . Prostate cancer ([place])       PAST SURGICAL HISTORY: Past Surgical History:  Procedure  Laterality Date  . INGUINAL HERNIA REPAIR  04/15/2011   Procedure: HERNIA REPAIR INGUINAL ADULT;  Surgeon: Troy Curry, MD;  Location: [facility];  Service: General;  Laterality: Left;  Left inguinal hernia repair with mesh.  Troy Burch PROSTATE BIOPSY    . TONSILLECTOMY      FAMILY HISTORY:  Family History  Problem Relation Age of Onset  . Pancreatic cancer Maternal Uncle     SOCIAL HISTORY:  Social History   Socioeconomic History  . Marital status: Married    Spouse name: Lolita Patella  . Number of children: 3  . Years of education: Not on file  . Highest education level: Not on file  Occupational History  . Occupation: unemployed    Comment: works on [employer] farm [employer]  . Financial resource strain: Not on file  . Food insecurity:    Worry: Never true    Inability: Never true  . Transportation needs:    Medical: No    Non-medical: No  Tobacco Use  . Smoking status: Former Smoker    Packs/day: 2.00    Years: 30.00    Pack years: 60.00    Types: Cigarettes    Last attempt to quit: 11/07/2010    Years since quitting: 6.7  . Smokeless tobacco: Never Used  Substance and Sexual Activity  . Alcohol use: Yes    Alcohol/week: 3.6 oz    Types: 6 Cans of beer per week  . Drug use: No  . Sexual activity: Not Currently  Lifestyle  . Physical activity:    Days per week: Patient refused    Minutes per session: Patient refused  . Stress: To some extent  Relationships  . Social connections:    Talks on phone: Not on file    Gets together: Not on file    Attends religious service: Not on file    Active member of club or organization: Not on file    Attends meetings of clubs or organizations: Not on file    Relationship status: Not on file  . Intimate partner violence:    Fear of current or ex partner: Not on file    Emotionally abused: Not on file    Physically abused: Not on file    Forced sexual activity: Not on file  Other Topics Concern  . Not on file  Social History Narrative  .  Not on file    ALLERGIES: Patient has no known allergies.  MEDICATIONS:  Current Outpatient Medications  Medication Sig Dispense Refill  . acetaminophen (TYLENOL) 500 MG tablet Take 500 mg by mouth every 6 (six) hours as needed.    Troy Burch albuterol (PROVENTIL HFA;VENTOLIN HFA) 108 (90 BASE) MCG/ACT inhaler Inhale 2 puffs into the lungs every 6 (six) hours as needed. For wheezing.    . budesonide-formoterol (SYMBICORT) 160-4.5 MCG/ACT inhaler Inhale 2 puffs into the lungs 2 (two) times daily.    . Cetirizine HCl (ALLERGY RELIEF) 10 MG CAPS Take by mouth.    . escitalopram (LEXAPRO) 10 MG tablet     . ferrous sulfate 325 (65 FE) MG tablet Take 325 mg by mouth daily with breakfast.    . Melatonin 10 MG CAPS Take 10 mg by mouth.    . Multiple Vitamin (MULTIVITAMIN) tablet Take 1 tablet by mouth daily.    . vitamin B-12 (CYANOCOBALAMIN) 1000 MCG tablet Take 1,000 mcg by mouth daily.    Troy Burch ibuprofen (ADVIL,MOTRIN) 200 MG tablet Take 200 mg by mouth every 6 (six) hours as needed.     No current facility-administered medications for this encounter.     REVIEW OF SYSTEMS:  On review of systems, the patient reports that he is doing well overall. He denies any chest pain, shortness of breath, cough, fevers, chills, night sweats, unintended weight changes. He denies any bowel disturbances, and denies abdominal pain, nausea or vomiting. He denies any new musculoskeletal or joint aches or pains. His IPSS was 12, indicating moderate urinary symptoms. He is able to complete sexual activity with some attempts. A complete review of systems is obtained and is otherwise negative.    PHYSICAL EXAM:  Wt Readings from Last 3 Encounters:  08/22/17 158 lb 3.2 oz (71.8 kg)  08/22/17 158 lb 3.2 oz (71.8 kg)  06/03/17 160 lb (72.6 kg)   Temp Readings from Last 3 Encounters:  08/22/17 97.7 F (36.5 C) (Oral)  08/22/17 97.7 F (36.5 C)  05/12/11 97.9 F (36.6 C) (Oral)   BP Readings from Last 3 Encounters:    08/22/17 124/84  08/22/17 124/84  06/03/17 (!) 147/88   Pulse Readings from Last 3 Encounters:  08/22/17 81  08/22/17 81  06/03/17 86   Pain Assessment Pain Score: 0-No pain/10  In general this is a well appearing caucasian male in no acute distress. He's alert and oriented x4 and appropriate throughout the examination. Cardiopulmonary assessment is negative for acute distress and he exhibits normal effort.   KPS = 100  100 - Normal; no complaints; no evidence of disease. 90   -  Able to carry on normal activity; minor signs or symptoms of disease. 80   - Normal activity with effort; some signs or symptoms of disease. 108   - Cares for self; unable to carry on normal activity or to do active work. 60   - Requires occasional assistance, but is able to care for most of his personal needs. 50   - Requires considerable assistance and frequent medical care. 39   - Disabled; requires special care and assistance. 97   - Severely disabled; hospital admission is indicated although death not imminent. 22   - Very sick; hospital admission necessary; active supportive treatment necessary. 10   - Moribund; fatal processes progressing rapidly. 0     - Dead  Karnofsky DA, Abelmann Manilla, Craver LS and Burchenal [facility] [phone]) The use of the nitrogen mustards in the palliative treatment of carcinoma: with particular reference to bronchogenic carcinoma Cancer 1 634-56  LABORATORY DATA:  Lab Results  Component Value Date   WBC 12.2 (H) 04/14/2011   HGB 9.0 (L) 04/14/2011   HCT 30.9 (L) 04/14/2011   MCV 71.5 (L) 04/14/2011   PLT 531 (H) 04/14/2011   Lab Results  Component Value Date   NA 137 04/09/2011   K 4.3 04/09/2011   CL 101 04/09/2011   CO2 25 04/09/2011   No results found for: ALT, AST, GGT, ALKPHOS, BILITOT   RADIOGRAPHY: No results found.    IMPRESSION/PLAN: 1. This gentleman is a 62 y.o. gentleman with unfavorable intermediate risk, stage T2b adenocarcinoma of the prostate with a  Gleason's score of 4+3 and a PSA of 6.5.  His T-Stage, Gleason's Score, and PSA put him into the intermediate risk group.  Accordingly he is eligible for a variety of potential treatment options including prostatectomy, brachytherapy, and external radiation therapy. We met with the patient and his wife about the findings and work-up thus far.  We discussed the natural history of prostate cancer and general treatment, highlighting the role of radiotherapy in the management.  We discussed the available radiation techniques, and focused on the details of logistics and delivery.  We reviewed the anticipated acute and late sequelae associated with radiation in this setting.  At this point, the patient is undecided on course of treatment but does seem to be leaning towards brachytherapy. He will contact our office once he has decided and we will arrange for CT planning with treatment start shortly thereafter.  We spent 60 minutes minutes face to face with the patient and more than 50% of that time was spent in counseling and/or coordination of care.     Carola Rhine, PAC  And  Sheral Apley Tammi Klippel, M.D.  This document serves as a record of services personally performed by Tyler Pita, MD and Shona Simpson, PA-C. It was created on their behalf by Linward Natal, a trained medical scribe. The creation of this record is based on the scribe's personal observations and the provider's statements to them. This document has been checked and approved by the attending provider.

## 2017-08-23 ENCOUNTER — Encounter: Payer: Self-pay | Admitting: Radiation Oncology

## 2017-08-23 NOTE — Addendum Note (Signed)
Encounter addended by: Heywood Footman, RN on: 08/23/2017 10:14 AM  Actions taken: Sign clinical note

## 2017-09-01 ENCOUNTER — Telehealth: Payer: Self-pay | Admitting: Radiation Oncology

## 2017-09-01 NOTE — Telephone Encounter (Signed)
I called the patient's wife thinking I was calling his mobile, but reached him. He was unavailable so I left him a message asking to call back at [facility] direct line.

## 2017-09-06 ENCOUNTER — Telehealth: Payer: Self-pay | Admitting: Radiation Oncology

## 2017-09-06 DIAGNOSIS — C61 Malignant neoplasm of prostate: Secondary | ICD-10-CM | POA: Diagnosis not present

## 2017-09-06 DIAGNOSIS — N5201 Erectile dysfunction due to arterial insufficiency: Secondary | ICD-10-CM | POA: Diagnosis not present

## 2017-09-06 DIAGNOSIS — R3915 Urgency of urination: Secondary | ICD-10-CM | POA: Diagnosis not present

## 2017-09-06 NOTE — Telephone Encounter (Signed)
I called and spoke with the patient's wife and the patient has decided to proceed with seed implant. I've contacted scheduling to proceed.

## 2017-09-09 ENCOUNTER — Telehealth: Payer: Self-pay | Admitting: *Deleted

## 2017-09-09 NOTE — Telephone Encounter (Signed)
CALLED PATIENT TO INFORM OF PRE-SEED APPT. FOR 09-16-17, LVM FOR A RETURN CALL

## 2017-09-12 NOTE — Progress Notes (Signed)
  Radiation Oncology         [phone]) [phone] ________________________________  Name: Troy Burch MRN: [id number]  Date: 09/16/2017  DOB: 02-Feb-1956  SIMULATION AND TREATMENT PLANNING NOTE PUBIC ARCH STUDY  TR:RNHAF, Caren Griffins, MD  Alexis Frock, MD  DIAGNOSIS: 62 y.o.gentleman with stage T2badenocarcinoma of the prostate with a Gleason's score of 4+3and a PSA of 6.5     ICD-10-CM   1. Malignant neoplasm of prostate ([place]) C61     COMPLEX SIMULATION:  The patient presented today for evaluation for possible prostate seed implant. He was brought to the radiation planning suite and placed supine on the CT couch. A 3-dimensional image study set was obtained in upload to the planning computer. There, on each axial slice, I contoured the prostate gland. Then, using three-dimensional radiation planning tools I reconstructed the prostate in view of the structures from the transperineal needle pathway to assess for possible pubic arch interference. In doing so, I did not appreciate any pubic arch interference. Also, the patient's prostate volume was estimated based on the drawn structure. The volume was 25 cc.  Given the pubic arch appearance and prostate volume, patient remains a good candidate to proceed with prostate seed implant. Today, he freely provided informed written consent to proceed.    PLAN: The patient will undergo prostate seed implant.   ________________________________  Sheral Apley. Tammi Klippel, M.D.  This document serves as a record of services personally performed by Tyler Pita, MD. It was created on his behalf by Margit Banda, a trained medical scribe. The creation of this record is based on the scribe's personal observations and the provider's statements to them. This document has been checked and approved by the attending provider.

## 2017-09-14 ENCOUNTER — Telehealth: Payer: Self-pay | Admitting: *Deleted

## 2017-09-14 NOTE — Telephone Encounter (Signed)
CALLED PATIENT TO REMIND OF PRE-SEED APPT. FOR 09-16-17, LVM FOR A RETURN CALL

## 2017-09-16 ENCOUNTER — Ambulatory Visit
Admission: RE | Admit: 2017-09-16 | Discharge: 2017-09-16 | Disposition: A | Discharge status: Home / Self Care | Payer: Medicare Other | Source: Ambulatory Visit | Attending: Radiation Oncology | Admitting: Radiation Oncology

## 2017-09-16 ENCOUNTER — Encounter: Payer: Self-pay | Admitting: Medical Oncology

## 2017-09-16 DIAGNOSIS — C61 Malignant neoplasm of prostate: Secondary | ICD-10-CM | POA: Diagnosis not present

## 2017-09-19 ENCOUNTER — Other Ambulatory Visit: Payer: Self-pay | Admitting: Urology

## 2017-09-26 ENCOUNTER — Telehealth: Payer: Self-pay | Admitting: *Deleted

## 2017-09-26 NOTE — Telephone Encounter (Signed)
CALLED PATIENT TO INFORM OF IMPLANT DATE, SPOKE WITH PATIENT AND HE IS AWARE OF THIS DATE 

## 2017-09-28 ENCOUNTER — Other Ambulatory Visit: Payer: Self-pay

## 2017-09-28 ENCOUNTER — Encounter (HOSPITAL_COMMUNITY)
Admission: RE | Admit: 2017-09-28 | Discharge: 2017-09-28 | Disposition: A | Discharge status: Home / Self Care | Payer: Medicare Other | Source: Ambulatory Visit | Attending: Urology | Admitting: Urology

## 2017-09-28 ENCOUNTER — Ambulatory Visit (HOSPITAL_COMMUNITY)
Admission: RE | Admit: 2017-09-28 | Discharge: 2017-09-28 | Disposition: A | Discharge status: Home / Self Care | Payer: Medicare Other | Source: Ambulatory Visit | Attending: Urology | Admitting: Urology

## 2017-09-28 DIAGNOSIS — J984 Other disorders of lung: Secondary | ICD-10-CM | POA: Insufficient documentation

## 2017-09-28 DIAGNOSIS — R918 Other nonspecific abnormal finding of lung field: Secondary | ICD-10-CM | POA: Diagnosis not present

## 2017-09-28 DIAGNOSIS — J439 Emphysema, unspecified: Secondary | ICD-10-CM | POA: Diagnosis not present

## 2017-09-28 DIAGNOSIS — Z01818 Encounter for other preprocedural examination: Secondary | ICD-10-CM | POA: Insufficient documentation

## 2017-09-29 NOTE — Progress Notes (Signed)
Abnormal cxr result dated 09-29-2017 sent to dr Tresa Moore in epic.

## 2017-10-18 ENCOUNTER — Encounter (HOSPITAL_BASED_OUTPATIENT_CLINIC_OR_DEPARTMENT_OTHER): Payer: Self-pay | Admitting: *Deleted

## 2017-10-18 ENCOUNTER — Other Ambulatory Visit: Payer: Self-pay

## 2017-10-18 NOTE — Progress Notes (Signed)
Spoke w/ pt via phone for pre-op interview.  Npo after mn w/ exception clear liquids until 0800 (no cream/milk products).  Arrive at 1200.  Getting lab work done Tuesday 10-25-2017 @ 1000 (cbc, cmet,pt/inr, ptt).  Current cxr and ekg in chart and epic.  Will take lexapro , ceritizine , and do symbicort inhaler am dos w/ sips of water.  Will do fleet enema am dso.

## 2017-10-21 ENCOUNTER — Telehealth: Payer: Self-pay | Admitting: *Deleted

## 2017-10-21 NOTE — Telephone Encounter (Signed)
Called patient to remind of lab appt. For 10-25-17 @ 10 am @ [facility], spoke with patient and he is aware of this appt.

## 2017-10-25 ENCOUNTER — Other Ambulatory Visit: Payer: Self-pay | Admitting: Urology

## 2017-10-25 ENCOUNTER — Encounter (HOSPITAL_COMMUNITY)
Admission: RE | Admit: 2017-10-25 | Discharge: 2017-10-25 | Disposition: A | Discharge status: Home / Self Care | Payer: Medicare Other | Source: Ambulatory Visit | Attending: Urology | Admitting: Urology

## 2017-10-25 DIAGNOSIS — C61 Malignant neoplasm of prostate: Secondary | ICD-10-CM | POA: Diagnosis not present

## 2017-10-25 DIAGNOSIS — R3915 Urgency of urination: Secondary | ICD-10-CM | POA: Diagnosis not present

## 2017-10-25 DIAGNOSIS — Z01812 Encounter for preprocedural laboratory examination: Secondary | ICD-10-CM | POA: Insufficient documentation

## 2017-10-25 LAB — CBC
HEMATOCRIT: 41.8 % (ref 39.0–52.0)
HEMOGLOBIN: 13.1 g/dL (ref 13.0–17.0)
MCH: 25.4 pg — AB (ref 26.0–34.0)
MCHC: 31.3 g/dL (ref 30.0–36.0)
MCV: 81 fL (ref 78.0–100.0)
Platelets: 333 10*3/uL (ref 150–400)
RBC: 5.16 MIL/uL (ref 4.22–5.81)
RDW: 17.1 % — ABNORMAL HIGH (ref 11.5–15.5)
WBC: 9.8 10*3/uL (ref 4.0–10.5)

## 2017-10-25 LAB — COMPREHENSIVE METABOLIC PANEL
ALK PHOS: 69 U/L (ref 38–126)
ALT: 13 U/L (ref 0–44)
AST: 17 U/L (ref 15–41)
Albumin: 3.7 g/dL (ref 3.5–5.0)
Anion gap: 9 (ref 5–15)
BUN: 21 mg/dL (ref 8–23)
CALCIUM: 9.2 mg/dL (ref 8.9–10.3)
CO2: 26 mmol/L (ref 22–32)
CREATININE: 1.2 mg/dL (ref 0.61–1.24)
Chloride: 102 mmol/L (ref 98–111)
Glucose, Bld: 83 mg/dL (ref 70–99)
Potassium: 4.2 mmol/L (ref 3.5–5.1)
Sodium: 137 mmol/L (ref 135–145)
Total Bilirubin: 0.6 mg/dL (ref 0.3–1.2)
Total Protein: 8 g/dL (ref 6.5–8.1)

## 2017-10-25 LAB — PROTIME-INR
INR: 1
PROTHROMBIN TIME: 13.1 s (ref 11.4–15.2)

## 2017-10-25 LAB — APTT: aPTT: 40 seconds — ABNORMAL HIGH (ref 24–36)

## 2017-10-25 NOTE — Pre-Procedure Instructions (Signed)
CBC and PTT faxed to Dr. Tresa Moore via epic.

## 2017-10-28 ENCOUNTER — Telehealth: Payer: Self-pay | Admitting: *Deleted

## 2017-10-28 NOTE — Telephone Encounter (Signed)
CALLED PATIENT TO REMIND OF PROCEDURE FOR 10-31-17 , SPOKE WITH PATIENT AND HE IS AWARE OF THIS PROCEDURE

## 2017-10-31 ENCOUNTER — Other Ambulatory Visit: Payer: Self-pay

## 2017-10-31 ENCOUNTER — Ambulatory Visit (HOSPITAL_COMMUNITY): Payer: Medicare Other

## 2017-10-31 ENCOUNTER — Ambulatory Visit (HOSPITAL_BASED_OUTPATIENT_CLINIC_OR_DEPARTMENT_OTHER): Payer: Medicare Other | Admitting: Anesthesiology

## 2017-10-31 ENCOUNTER — Encounter (HOSPITAL_BASED_OUTPATIENT_CLINIC_OR_DEPARTMENT_OTHER)
Admission: RE | Disposition: A | Discharge status: Home / Self Care | Payer: Self-pay | Source: Ambulatory Visit | Attending: Urology

## 2017-10-31 ENCOUNTER — Ambulatory Visit (HOSPITAL_BASED_OUTPATIENT_CLINIC_OR_DEPARTMENT_OTHER)
Admission: RE | Admit: 2017-10-31 | Discharge: 2017-10-31 | Disposition: A | Discharge status: Home / Self Care | Payer: Medicare Other | Source: Ambulatory Visit | Attending: Urology | Admitting: Urology

## 2017-10-31 ENCOUNTER — Encounter (HOSPITAL_BASED_OUTPATIENT_CLINIC_OR_DEPARTMENT_OTHER): Payer: Self-pay | Admitting: Certified Registered"

## 2017-10-31 DIAGNOSIS — C61 Malignant neoplasm of prostate: Secondary | ICD-10-CM | POA: Insufficient documentation

## 2017-10-31 DIAGNOSIS — J449 Chronic obstructive pulmonary disease, unspecified: Secondary | ICD-10-CM | POA: Diagnosis not present

## 2017-10-31 DIAGNOSIS — Z87891 Personal history of nicotine dependence: Secondary | ICD-10-CM | POA: Insufficient documentation

## 2017-10-31 DIAGNOSIS — Z01818 Encounter for other preprocedural examination: Secondary | ICD-10-CM

## 2017-10-31 DIAGNOSIS — J439 Emphysema, unspecified: Secondary | ICD-10-CM | POA: Insufficient documentation

## 2017-10-31 HISTORY — DX: Other forms of dyspnea: R06.09

## 2017-10-31 HISTORY — DX: Other specified cough: R05.8

## 2017-10-31 HISTORY — DX: Presence of spectacles and contact lenses: Z97.3

## 2017-10-31 HISTORY — DX: Poor urinary stream: R39.12

## 2017-10-31 HISTORY — DX: Dyspnea, unspecified: R06.00

## 2017-10-31 HISTORY — PX: RADIOACTIVE SEED IMPLANT: SHX5150

## 2017-10-31 HISTORY — DX: Male erectile dysfunction, unspecified: N52.9

## 2017-10-31 HISTORY — DX: Unspecified symptoms and signs involving the genitourinary system: R39.9

## 2017-10-31 HISTORY — DX: Other disorders of lung: J98.4

## 2017-10-31 HISTORY — PX: SPACE OAR INSTILLATION: SHX6769

## 2017-10-31 HISTORY — DX: Iron deficiency anemia, unspecified: D50.9

## 2017-10-31 HISTORY — DX: Cough: R05

## 2017-10-31 HISTORY — DX: Chronic obstructive pulmonary disease, unspecified: J44.9

## 2017-10-31 SURGERY — INSERTION, RADIATION SOURCE, PROSTATE
Anesthesia: General | Site: Prostate

## 2017-10-31 MED ORDER — PROPOFOL 10 MG/ML IV BOLUS
INTRAVENOUS | Status: AC
  Filled 2017-10-31: qty 20

## 2017-10-31 MED ORDER — PHENYLEPHRINE 40 MCG/ML (10ML) SYRINGE FOR IV PUSH (FOR BLOOD PRESSURE SUPPORT)
PREFILLED_SYRINGE | INTRAVENOUS | Status: DC | PRN
  Administered 2017-10-31 (×4): 40 ug via INTRAVENOUS

## 2017-10-31 MED ORDER — FENTANYL CITRATE (PF) 100 MCG/2ML IJ SOLN
INTRAMUSCULAR | Status: AC
  Filled 2017-10-31: qty 2

## 2017-10-31 MED ORDER — SENNOSIDES-DOCUSATE SODIUM 8.6-50 MG PO TABS: 1.0000 | ORAL_TABLET | Freq: Two times a day (BID) | ORAL | 0 refills | Status: AC

## 2017-10-31 MED ORDER — SODIUM CHLORIDE FLUSH 0.9 % IV SOLN
INTRAVENOUS | Status: DC | PRN
  Administered 2017-10-31: 10 mL via INTRAVENOUS

## 2017-10-31 MED ORDER — ONDANSETRON HCL 4 MG/2ML IJ SOLN
INTRAMUSCULAR | Status: DC | PRN
  Administered 2017-10-31: 4 mg via INTRAVENOUS

## 2017-10-31 MED ORDER — IOHEXOL 300 MG/ML  SOLN
INTRAMUSCULAR | Status: DC | PRN
  Administered 2017-10-31: 7 mL

## 2017-10-31 MED ORDER — MIDAZOLAM HCL 2 MG/2ML IJ SOLN
INTRAMUSCULAR | Status: AC
  Filled 2017-10-31: qty 2

## 2017-10-31 MED ORDER — PROPOFOL 10 MG/ML IV BOLUS
INTRAVENOUS | Status: DC | PRN
  Administered 2017-10-31: 50 mg via INTRAVENOUS
  Administered 2017-10-31: 150 mg via INTRAVENOUS

## 2017-10-31 MED ORDER — DEXAMETHASONE SODIUM PHOSPHATE 10 MG/ML IJ SOLN
INTRAMUSCULAR | Status: DC | PRN
  Administered 2017-10-31: 10 mg via INTRAVENOUS

## 2017-10-31 MED ORDER — FLEET ENEMA 7-19 GM/118ML RE ENEM
1.0000 | ENEMA | Freq: Once | RECTAL | Status: AC
  Administered 2017-10-31: 1 via RECTAL
  Filled 2017-10-31: qty 1

## 2017-10-31 MED ORDER — TAMSULOSIN HCL 0.4 MG PO CAPS: 0.4000 mg | ORAL_CAPSULE | Freq: Every day | ORAL | 3 refills | Status: AC | PRN

## 2017-10-31 MED ORDER — DEXAMETHASONE SODIUM PHOSPHATE 10 MG/ML IJ SOLN
INTRAMUSCULAR | Status: AC
  Filled 2017-10-31: qty 1

## 2017-10-31 MED ORDER — FENTANYL CITRATE (PF) 100 MCG/2ML IJ SOLN
INTRAMUSCULAR | Status: DC | PRN
  Administered 2017-10-31 (×2): 25 ug via INTRAVENOUS
  Administered 2017-10-31: 50 ug via INTRAVENOUS

## 2017-10-31 MED ORDER — LIDOCAINE 2% (20 MG/ML) 5 ML SYRINGE
INTRAMUSCULAR | Status: DC | PRN
  Administered 2017-10-31: 100 mg via INTRAVENOUS

## 2017-10-31 MED ORDER — GENTAMICIN SULFATE 40 MG/ML IJ SOLN
5.0000 mg/kg | INTRAVENOUS | Status: AC
  Administered 2017-10-31: 351.2 mg via INTRAVENOUS
  Filled 2017-10-31: qty 8.75

## 2017-10-31 MED ORDER — TRAMADOL HCL 50 MG PO TABS: 50.0000 mg | ORAL_TABLET | Freq: Four times a day (QID) | ORAL | 0 refills | Status: AC | PRN

## 2017-10-31 MED ORDER — ONDANSETRON HCL 4 MG/2ML IJ SOLN
INTRAMUSCULAR | Status: AC
  Filled 2017-10-31: qty 2

## 2017-10-31 MED ORDER — GENTAMICIN SULFATE 40 MG/ML IJ SOLN
5.0000 mg/kg | Freq: Once | INTRAVENOUS | Status: DC
  Filled 2017-10-31: qty 9

## 2017-10-31 MED ORDER — PHENYLEPHRINE 40 MCG/ML (10ML) SYRINGE FOR IV PUSH (FOR BLOOD PRESSURE SUPPORT)
PREFILLED_SYRINGE | INTRAVENOUS | Status: AC
  Filled 2017-10-31: qty 10

## 2017-10-31 MED ORDER — MIDAZOLAM HCL 5 MG/5ML IJ SOLN
INTRAMUSCULAR | Status: DC | PRN
  Administered 2017-10-31: 2 mg via INTRAVENOUS

## 2017-10-31 MED ORDER — EPHEDRINE SULFATE-NACL 50-0.9 MG/10ML-% IV SOSY
PREFILLED_SYRINGE | INTRAVENOUS | Status: DC | PRN
  Administered 2017-10-31 (×6): 5 mg via INTRAVENOUS

## 2017-10-31 MED ORDER — EPHEDRINE 5 MG/ML INJ
INTRAVENOUS | Status: AC
  Filled 2017-10-31: qty 10

## 2017-10-31 MED ORDER — LACTATED RINGERS IV SOLN
INTRAVENOUS | Status: DC
  Administered 2017-10-31 (×2): via INTRAVENOUS
  Filled 2017-10-31: qty 1000

## 2017-10-31 MED ORDER — SODIUM CHLORIDE 0.9 % IR SOLN
Status: DC | PRN
  Administered 2017-10-31: 1000 mL

## 2017-10-31 SURGICAL SUPPLY — 37 items
BAG URINE DRAINAGE (UROLOGICAL SUPPLIES) ×3 IMPLANT
BLADE CLIPPER SURG (BLADE) ×3 IMPLANT
CATH FOLEY 2WAY SLVR  5CC 16FR (CATHETERS) ×2
CATH FOLEY 2WAY SLVR 5CC 16FR (CATHETERS) ×1 IMPLANT
CATH ROBINSON RED A/P 16FR (CATHETERS) IMPLANT
CATH ROBINSON RED A/P 20FR (CATHETERS) ×3 IMPLANT
CLOTH BEACON ORANGE TIMEOUT ST (SAFETY) ×3 IMPLANT
CONT SPECI 4OZ STER CLIK (MISCELLANEOUS) ×6 IMPLANT
COVER BACK TABLE 60X90IN (DRAPES) ×3 IMPLANT
COVER MAYO STAND STRL (DRAPES) ×3 IMPLANT
DRSG TEGADERM 4X4.75 (GAUZE/BANDAGES/DRESSINGS) ×3 IMPLANT
DRSG TEGADERM 8X12 (GAUZE/BANDAGES/DRESSINGS) ×3 IMPLANT
GAUZE SPONGE 4X4 12PLY STRL (GAUZE/BANDAGES/DRESSINGS) ×3 IMPLANT
GLOVE BIO SURGEON STRL SZ 6 (GLOVE) IMPLANT
GLOVE BIO SURGEON STRL SZ7 (GLOVE) IMPLANT
GLOVE BIO SURGEON STRL SZ7.5 (GLOVE) ×3 IMPLANT
GLOVE BIO SURGEON STRL SZ8 (GLOVE) IMPLANT
GLOVE BIOGEL PI IND STRL 6 (GLOVE) IMPLANT
GLOVE BIOGEL PI IND STRL 8 (GLOVE) IMPLANT
GLOVE BIOGEL PI INDICATOR 6 (GLOVE)
GLOVE BIOGEL PI INDICATOR 8 (GLOVE)
GLOVE ECLIPSE 8.0 STRL XLNG CF (GLOVE) ×12 IMPLANT
GLOVE INDICATOR 7.0 STRL GRN (GLOVE) IMPLANT
GOWN STRL REUS W/TWL LRG LVL3 (GOWN DISPOSABLE) ×6 IMPLANT
GOWN STRL REUS W/TWL XL LVL3 (GOWN DISPOSABLE) ×3 IMPLANT
HOLDER FOLEY CATH W/STRAP (MISCELLANEOUS) IMPLANT
I-SEED AGX-100 ×3 IMPLANT
IMPL SPACEOAR SYSTEM 10ML (MISCELLANEOUS) ×1 IMPLANT
IMPLANT SPACEOAR SYSTEM 10ML (MISCELLANEOUS) ×3
IV SOD CHL 0.9% 1000ML (IV SOLUTION) ×3 IMPLANT
KIT TURNOVER CYSTO (KITS) ×3 IMPLANT
MARKER SKIN DUAL TIP RULER LAB (MISCELLANEOUS) ×3 IMPLANT
PACK CYSTO (CUSTOM PROCEDURE TRAY) ×3 IMPLANT
SURGILUBE 2OZ TUBE FLIPTOP (MISCELLANEOUS) ×3 IMPLANT
SYR 10ML LL (SYRINGE) ×3 IMPLANT
UNDERPAD 30X30 (UNDERPADS AND DIAPERS) ×6 IMPLANT
WATER STERILE IRR 500ML POUR (IV SOLUTION) ×3 IMPLANT

## 2017-10-31 NOTE — H&P (Signed)
Troy Burch is an 62 y.o. male.    Chief Complaint: Pre-Op Brachytherapy seen implantation + SPACE-OAR  HPI:   1 - Moderate Risk Prostate Cancer - Gleason 3+4=7 in up to 80% of 5/12 cores by BS 05/2017 on eval rising PSA to 6.5 with right nodules. TRUS BX 05/2017 25 mL, no median lobe. Had opinion from radiation oncology.   2 - Lower Urinary Tract Symptoms - pt with modest bother from irritative symptoms with urgency / frequency that has been slowly progressive. No incontinence. Not interested in med trial.   PMH sig for COPD, left inguinal hernia repair. His PCP is Neurosurgeon PA with [facility].   Today "Troy Burch" is seen to proceed with primary brachytherapy for prostate cancer. His pre-op CXR did show some apical.    Past Medical History:  Diagnosis Date  . Cavitary lung disease    previously seen by pulmologist-- dr clance-- lov in epic 2013 (bilateral upper lobes)  . COPD, severe ([place])    followed by pcp--- per pt no supplemental oxygen  . DOE (dyspnea on exertion)   . ED (erectile dysfunction)   . Emphysema of lung ([place])    per pt has to lay down head elevated  . Iron deficiency anemia   . Lower urinary tract symptoms (LUTS)   . Osteoarthritis   . Pneumonia    last event September 2012  . Productive cough    per pt at times productive  . Prostate cancer [facility]) urologist-  dr Tresa Moore  oncologist-- dr Tammi Klippel   dx 06-09-2017-- Stage T2b, Gleason 4+3, PSA  6.5--  scheduled for radioactive seed implants 10-31-2017  . Weak urinary stream   . Wears glasses     Past Surgical History:  Procedure Laterality Date  . INGUINAL HERNIA REPAIR  04/15/2011   Procedure: HERNIA REPAIR INGUINAL ADULT;  Surgeon: Gayland Curry, MD;  Location: [place];  Service: General;  Laterality: Left;  Left inguinal hernia repair with mesh.  Marland Kitchen PROSTATE BIOPSY  06-09-2017   dr Tresa Moore office  . TONSILLECTOMY  child    Family History  Problem Relation Age of Onset  . Diabetes Father   . Pancreatic  cancer Maternal Uncle    Social History:  reports that he quit smoking about 6 years ago. His smoking use included cigarettes. He has a 60.00 pack-year smoking history. He has never used smokeless tobacco. He reports that he drinks about 12.0 standard drinks of alcohol per week. He reports that he does not use drugs.  Allergies: No Known Allergies  No medications prior to admission.    No results found for this or any previous visit (from the past 48 hour(s)). No results found.  Review of Systems  Constitutional: Negative for chills and fever.  HENT: Negative.   Eyes: Negative.   Respiratory: Negative.   Cardiovascular: Negative.   Gastrointestinal: Negative.   Genitourinary: Negative.   Musculoskeletal: Negative.   Skin: Negative.   Neurological: Negative.   Endo/Heme/Allergies: Negative.   Psychiatric/Behavioral: Negative.     Height 5\' 10"  (1.778 m), weight 72.6 kg. Physical Exam  Constitutional: He appears well-developed.  HENT:  Head: Normocephalic.  Eyes: Pupils are equal, round, and reactive to light.  Neck: Normal range of motion.  Cardiovascular: Normal rate.  Respiratory: Effort normal.  GI: Soft.  Genitourinary:  Genitourinary Comments: No CVAT  Musculoskeletal: Normal range of motion.  Neurological: He is alert.  Skin: Skin is warm.  Psychiatric: He has a normal mood and  affect.     Assessment/Plan  Proceed as planned with primary brachytherapy and SPACE-OAR placement. Risks, benefits, alternatves, expeted peri-op course discussed previously and reiterated today. Will refer pulm for apical bleba and some ? matieral within them.   Alexis Frock, MD 10/31/2017, 7:46 AM

## 2017-10-31 NOTE — Anesthesia Procedure Notes (Signed)
Procedure Name: LMA Insertion Date/Time: 10/31/2017 2:14 PM Performed by: Gwyndolyn Saxon, CRNA Pre-anesthesia Checklist: Patient identified, Emergency Drugs available, Suction available and Patient being monitored Patient Re-evaluated:Patient Re-evaluated prior to induction Oxygen Delivery Method: Circle system utilized Preoxygenation: Pre-oxygenation with 100% oxygen Induction Type: IV induction Ventilation: Mask ventilation without difficulty LMA: LMA inserted LMA Size: 4.0 Number of attempts: 1 Airway Equipment and Method: Patient positioned with wedge pillow Placement Confirmation: positive ETCO2,  CO2 detector and breath sounds checked- equal and bilateral Tube secured with: Tape Dental Injury: Teeth and Oropharynx as per pre-operative assessment

## 2017-10-31 NOTE — Discharge Instructions (Signed)
1 - You may have urinary urgency (bladder spasms) and bloody urine on / off for up to 2 weeks. This is normal. ° °2 - Call MD or go to ER for fever >102, severe pain / nausea / vomiting not relieved by medications, or acute change in medical status ° ° °Post Anesthesia Home Care Instructions ° °Activity: °Get plenty of rest for the remainder of the day. A responsible adult should stay with you for 24 hours following the procedure.  °For the next 24 hours, DO NOT: °-Drive a car °-Operate machinery °-Drink alcoholic beverages °-Take any medication unless instructed by your physician °-Make any legal decisions or sign important papers. ° °Meals: °Start with liquid foods such as gelatin or soup. Progress to regular foods as tolerated. Avoid greasy, spicy, heavy foods. If nausea and/or vomiting occur, drink only clear liquids until the nausea and/or vomiting subsides. Call your physician if vomiting continues. ° °Special Instructions/Symptoms: °Your throat may feel dry or sore from the anesthesia or the breathing tube placed in your throat during surgery. If this causes discomfort, gargle with warm salt water. The discomfort should disappear within 24 hours. ° °If you had a scopolamine patch placed behind your ear for the management of post- operative nausea and/or vomiting: ° °1. The medication in the patch is effective for 72 hours, after which it should be removed.  Wrap patch in a tissue and discard in the trash. Wash hands thoroughly with soap and water. °2. You may remove the patch earlier than 72 hours if you experience unpleasant side effects which may include dry mouth, dizziness or visual disturbances. °3. Avoid touching the patch. Wash your hands with soap and water after contact with the patch. °  ° ° °

## 2017-10-31 NOTE — Op Note (Signed)
NAME: Troy Burch, BUSTA MEDICAL RECORD ZO:[id number] ACCOUNT [id number] DATE OF BIRTH:[birth year] FACILITY: [facility] LOCATION: [facility] PHYSICIAN:Destinee Taber Tresa Moore, MD  OPERATIVE REPORT  DATE OF PROCEDURE:  10/31/2017  PREOPERATIVE DIAGNOSIS:  Prostate cancer.  PROCEDURES: 1.  Brachytherapy seed implantation. 2.  Cystoscopy. 3.  Spacer injection.  ESTIMATED BLOOD LOSS:  Nil.  COMPLICATIONS:  None.  SPECIMENS:  None.  FINDINGS: 1.  Unremarkable urethra and prostate following seed implantation. 2.  No evidence of intraluminal seeds. 3.  Successful placement of 10 mL of a spacer gel matrix the prerectal space.  RADIATION PARAMETERS:  145 Gy prescribed dose over 24 catheters containing 71 seeds.  INDICATIONS:  the patient is a pleasant 62 year old gentleman with some fairly significant pulmonary, cardiovascular comorbidity.  He was found with elevated PSA to have multifocal adenocarcinoma of the prostate.  Options were discussed for management  including ablative therapies versus surgical extirpation versus surveillance protocols and he wished to proceed with ablative therapy with brachytherapy, seed implantation.  He was evaluated by radiation oncology colleagues and felt to be also a suitable  candidate.  He presents for seed implantation and spacer today.  Informed consent was obtained and placed in medical record.    DESCRIPTION OF PROCEDURE:  The patient identified as being [name], was verified procedure as being brachytherapy prostate, seed implantation with spacer and cystoscopy was confirmed.  Procedure timeout was performed.  Antibiotics administered,  general LMA anesthesia induced.  The patient was placed into a low lithotomy position, sterile field was created by prepping and draping the patient's penis, perineum and proximal thighs after Foley catheter was placed per urethra to straight drain, and  tucked out of the way to the perineum using a large Tegaderm.   Transrectal ultrasound was performed and intraoperative planning performed as per radiation oncology note.  After radiation oncology planning was performed,  it was felt that a 24 catheters  needing 71 seeds for prescribed dose 145 Gy would be optimal and then 24 catheters were sequentially delivered in an extubation remaining anterior, posterior orientation as per prescribed plan.  Spot fluoroscopic images anterior, posterior and oblique  was then performed, which revealed the expected placement of seeds.  No evidence of obvious intraprostatic placement.  The Foley catheter was removed and cystourethroscopy was performed.  Cystoscopy revealed unremarkable anterior and posterior urethra.  There was minimal kissing lobe prostatic hypertrophy.  There was minimal blood within the intraluminal space.  Inspection of bladder revealed no diverticula, calcifications, papillary  lesions.  There is no evidence of intraluminal or extravasation noted of brachytherapy seeds.  Retroflexion revealed no additional findings.  The cystoscope was then removed, purposely leaving approximately 200 mL of normal saline within the urinary  bladder.  The spacer placement was then performed using the included a finder needle under direct ultrasound showing mid gland, mid prostate.  The finding needle was introduced transperineally into the mid gland, prerectal fat area.  Next 2 mL of normal  saline was injected which corroborated excellent placement of the perirectal fat.  Next, 10 mL of a spacer gel matrix was injected as per manufacturer's guidelines over 10 seconds.  This resulted in excellent displacement of the anterior rectal wall away  from the posterior aspect of the prostate.  Procedure terminated.    The patient tolerated the procedure well.  No immediate complications.  Patient was taken to [facility] in stable condition for a trial of void and likely discharge home.  AN/NUANCE  D:10/31/2017  T:10/31/2017 JOB:[id number]

## 2017-10-31 NOTE — Anesthesia Postprocedure Evaluation (Signed)
Anesthesia Post Note  Patient: Troy Burch  Procedure(s) Performed: RADIOACTIVE SEED IMPLANT/BRACHYTHERAPY IMPLANT (N/A Prostate) SPACE OAR INSTILLATION (N/A Prostate)     Patient location during evaluation: PACU Anesthesia Type: General Level of consciousness: awake and alert Pain management: pain level controlled Vital Signs Assessment: post-procedure vital signs reviewed and stable Respiratory status: spontaneous breathing, nonlabored ventilation and respiratory function stable Cardiovascular status: blood pressure returned to baseline and stable Postop Assessment: no apparent nausea or vomiting Anesthetic complications: no    Last Vitals:  Vitals:   10/31/17 1155 10/31/17 1554  BP: 138/85 (!) 146/78  Pulse: 66 (!) 101  Resp: 16 20  Temp: 36.5 C 36.6 C  SpO2: 98% 99%    Last Pain:  Vitals:   10/31/17 1554  TempSrc:   PainSc: 0-No pain                 Chaunte Hornbeck A.

## 2017-10-31 NOTE — Brief Op Note (Signed)
10/31/2017  3:34 PM  PATIENT:  Aubert C Sanabia  62 y.o. male  PRE-OPERATIVE DIAGNOSIS:  PROSTATE CANCER  POST-OPERATIVE DIAGNOSIS:  PROSTATE CANCER  PROCEDURE:  Procedure(s): RADIOACTIVE SEED IMPLANT/BRACHYTHERAPY IMPLANT (N/A) SPACE OAR INSTILLATION (N/A)  SURGEON:  Surgeon(s) and Role:    * Alexis Frock, MD - Primary    * Tyler Pita, MD - Assisting  PHYSICIAN ASSISTANT:   ASSISTANTS: none   ANESTHESIA:   general  EBL:  0 mL   BLOOD ADMINISTERED:none  DRAINS: none   LOCAL MEDICATIONS USED:  NONE  SPECIMEN:  No Specimen  DISPOSITION OF SPECIMEN:  N/A  COUNTS:  YES  TOURNIQUET:  * No tourniquets in log *  DICTATION: .Other Dictation: Dictation Number [id number]  PLAN OF CARE: Discharge to home after PACU  PATIENT DISPOSITION:  PACU - hemodynamically stable.   Delay start of Pharmacological VTE agent (>24hrs) due to surgical blood loss or risk of bleeding: yes

## 2017-10-31 NOTE — Transfer of Care (Signed)
Immediate Anesthesia Transfer of Care Note  Patient: Troy Burch  Procedure(s) Performed: RADIOACTIVE SEED IMPLANT/BRACHYTHERAPY IMPLANT (N/A Prostate) SPACE OAR INSTILLATION (N/A Prostate)  Patient Location: PACU  Anesthesia Type:General  Level of Consciousness: awake, alert  and oriented  Airway & Oxygen Therapy: Patient Spontanous Breathing and Patient connected to face mask oxygen  Post-op Assessment: Report given to RN and Post -op Vital signs reviewed and stable  Post vital signs: Reviewed and stable  Last Vitals:  Vitals Value Taken Time  BP 146/78 10/31/2017  3:54 PM  Temp 36.6 C 10/31/2017  3:54 PM  Pulse 94 10/31/2017  3:58 PM  Resp 16 10/31/2017  3:58 PM  SpO2 100 % 10/31/2017  3:58 PM  Vitals shown include unvalidated device data.  Last Pain:  Vitals:   10/31/17 1554  TempSrc:   PainSc: 0-No pain      Patients Stated Pain Goal: 5 (62/83/15 1761)  Complications: No apparent anesthesia complications

## 2017-10-31 NOTE — Anesthesia Preprocedure Evaluation (Signed)
Anesthesia Evaluation  Patient identified by MRN, date of birth, ID band Patient awake    Reviewed: Allergy & Precautions, H&P , NPO status , Patient's Chart, lab work & pertinent test results, reviewed documented beta blocker date and time   History of Anesthesia Complications Negative for: history of anesthetic complications  Airway Mallampati: II  TM Distance: >3 FB Neck ROM: Full    Dental no notable dental hx. (+) Dental Advisory Given   Pulmonary pneumonia, COPD, former smoker,  Mod-sev COPD, cavitary lung disease (Dr.Clance)   Pulmonary exam normal        Cardiovascular negative cardio ROS Normal cardiovascular exam Rhythm:Regular - Systolic murmurs    Neuro/Psych negative neurological ROS  negative psych ROS   GI/Hepatic negative GI ROS, Neg liver ROS,   Endo/Other  negative endocrine ROS  Renal/GU negative Renal ROS     Musculoskeletal   Abdominal   Peds  Hematology negative hematology ROS (+)   Anesthesia Other Findings   Reproductive/Obstetrics                             Anesthesia Physical  Anesthesia Plan  ASA: III  Anesthesia Plan: General   Post-op Pain Management:  Regional for Post-op pain   Induction: Intravenous  PONV Risk Score and Plan: 3 and Ondansetron, Dexamethasone and Scopolamine patch - Pre-op  Airway Management Planned: LMA  Additional Equipment:   Intra-op Plan:   Post-operative Plan: Extubation in OR  Informed Consent: I have reviewed the patients History and Physical, chart, labs and discussed the procedure including the risks, benefits and alternatives for the proposed anesthesia with the patient or authorized representative who has indicated his/her understanding and acceptance.   Dental advisory given  Plan Discussed with:   Anesthesia Plan Comments:         Anesthesia Quick Evaluation

## 2017-11-01 ENCOUNTER — Encounter (HOSPITAL_BASED_OUTPATIENT_CLINIC_OR_DEPARTMENT_OTHER): Payer: Self-pay | Admitting: Urology

## 2017-11-03 NOTE — Progress Notes (Signed)
  Radiation Oncology         [phone] ________________________________  Name: CORREY WEIDNER MRN: [id number]  Date: 11/03/2017  DOB: 1956-01-10       Prostate Seed Implant  CC:Harlan Stains, MD  No ref. provider found  DIAGNOSIS: 62 y.o. 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 Pre-op testing Z01.818 DG Chest 2 View    DG Chest 2 View    PROCEDURE: Insertion of radioactive I-125 seeds into the prostate gland.  RADIATION DOSE: 145 Gy, definitive therapy.  TECHNIQUE: Giann C Goehring was brought to the operating room with the urologist. He was placed in the dorsolithotomy position. He was catheterized and a rectal tube was inserted. The perineum was shaved, prepped and draped. The ultrasound probe was then introduced into the rectum to see the prostate gland.  TREATMENT DEVICE: A needle grid was attached to the ultrasound probe stand and anchor needles were placed.  3D PLANNING: The prostate was imaged in 3D using a sagittal sweep of the prostate probe. These images were transferred to the planning computer. There, the prostate, urethra and rectum were defined on each axial reconstructed image. Then, the software created an optimized 3D plan and a few seed positions were adjusted. The quality of the plan was reviewed using [facility] information for the target and the following two organs at risk:  Urethra and Rectum.  Then the accepted plan was printed and handed off to the radiation therapist.  Under my supervision, the custom loading of the seeds and spacers was carried out and loaded into sealed vicryl sleeves.  These pre-loaded needles were then placed into the needle holder.Marland Kitchen  PROSTATE VOLUME STUDY:  Using transrectal ultrasound the volume of the prostate was verified to be 25 cc.  SPECIAL TREATMENT PROCEDURE/SUPERVISION AND HANDLING: The pre-loaded needles were then delivered under sagittal guidance.  A total of 24 needles were used to deposit 71 seeds in the prostate gland. The individual seed activity was 0.310 mCi.  SpaceOAR:  Yes  COMPLEX SIMULATION: At the end of the procedure, an anterior radiograph of the pelvis was obtained to document seed positioning and count. Cystoscopy was performed to check the urethra and bladder.  MICRODOSIMETRY: At the end of the procedure, the patient was emitting 0.16 mR/hr at 1 meter. Accordingly, he was considered safe for hospital discharge.  PLAN: The patient will return to the radiation oncology clinic for post implant CT dosimetry in three weeks.   ________________________________  Sheral Apley Tammi Klippel, M.D.

## 2017-11-09 ENCOUNTER — Telehealth: Payer: Self-pay | Admitting: *Deleted

## 2017-11-09 NOTE — Telephone Encounter (Signed)
Called patient to remind of post seed appts. and MRI for 11-10-17, spoke with patient and he is aware of these appts. 

## 2017-11-10 ENCOUNTER — Ambulatory Visit
Admission: RE | Admit: 2017-11-10 | Discharge: 2017-11-10 | Disposition: A | Discharge status: Home / Self Care | Payer: Medicare Other | Source: Ambulatory Visit | Attending: Radiation Oncology | Admitting: Radiation Oncology

## 2017-11-10 ENCOUNTER — Encounter: Payer: Self-pay | Admitting: Medical Oncology

## 2017-11-10 ENCOUNTER — Other Ambulatory Visit: Payer: Self-pay

## 2017-11-10 ENCOUNTER — Ambulatory Visit (HOSPITAL_COMMUNITY)
Admit: 2017-11-10 | Discharge: 2017-11-10 | Disposition: A | Discharge status: Home / Self Care | Payer: Medicare Other | Attending: Urology | Admitting: Urology

## 2017-11-10 ENCOUNTER — Ambulatory Visit (HOSPITAL_COMMUNITY)
Admission: RE | Admit: 2017-11-10 | Discharge: 2017-11-10 | Disposition: A | Discharge status: Home / Self Care | Payer: Medicare Other | Source: Ambulatory Visit | Attending: Urology | Admitting: Urology

## 2017-11-10 ENCOUNTER — Other Ambulatory Visit: Payer: Self-pay | Admitting: Urology

## 2017-11-10 ENCOUNTER — Encounter: Payer: Self-pay | Admitting: Radiation Oncology

## 2017-11-10 VITALS — BP 112/89 | HR 90 | Temp 99.0°F | Resp 20 | Ht 70.0 in | Wt 155.8 lb

## 2017-11-10 DIAGNOSIS — Z8546 Personal history of malignant neoplasm of prostate: Secondary | ICD-10-CM | POA: Diagnosis not present

## 2017-11-10 DIAGNOSIS — C61 Malignant neoplasm of prostate: Secondary | ICD-10-CM

## 2017-11-10 DIAGNOSIS — Z923 Personal history of irradiation: Secondary | ICD-10-CM | POA: Diagnosis not present

## 2017-11-10 DIAGNOSIS — Z08 Encounter for follow-up examination after completed treatment for malignant neoplasm: Secondary | ICD-10-CM | POA: Insufficient documentation

## 2017-11-10 DIAGNOSIS — Z135 Encounter for screening for eye and ear disorders: Secondary | ICD-10-CM | POA: Diagnosis not present

## 2017-11-10 DIAGNOSIS — Z79899 Other long term (current) drug therapy: Secondary | ICD-10-CM | POA: Diagnosis not present

## 2017-11-10 NOTE — Progress Notes (Signed)
  Radiation Oncology         [phone]) [phone] ________________________________  Name: Troy Burch MRN: [id number]  Date: 11/10/2017  DOB: January 12, 1956  COMPLEX SIMULATION NOTE  NARRATIVE:  The patient was brought to the [facility] today following prostate seed implantation approximately one month ago.  Identity was confirmed.  All relevant records and images related to the planned course of therapy were reviewed.  Then, the patient was set-up supine.  CT images were obtained.  The CT images were loaded into the planning software.  Then the prostate and rectum were contoured.  Treatment planning then occurred.  The implanted iodine 125 seeds were identified by the physics staff for projection of radiation distribution  I have requested : 3D Simulation  I have requested a DVH of the following structures: Prostate and rectum.    ________________________________  Sheral Apley Tammi Klippel, M.D.  This document serves as a record of services personally performed by Tyler Pita, MD. It was created on his behalf by Rae Lips, a trained medical scribe. The creation of this record is based on the scribe's personal observations and the provider's statements to them. This document has been checked and approved by the attending provider.

## 2017-11-10 NOTE — Progress Notes (Signed)
Radiation Oncology         [phone]) [phone] ________________________________  Name: Troy Burch MRN: [id number]  Date: 11/10/2017  DOB: 01/30/1956  Post-Seed Follow-Up Visit Note  CC: Harlan Stains, MD  Alexis Frock, MD  Diagnosis:   62 y.o. gentleman with stage T2badenocarcinoma of the prostate with a Gleason's score of4+3and a PSA of6.5     ICD-10-CM   1. Malignant neoplasm of prostate ([place]) C61     Interval Since Last Radiation:  1.5 weeks - Insertion of radioactive I-125 seeds into the prostate gland, 145 Gy definitive therapy, with SpaceOAR  Narrative:  The patient returns today for routine follow-up.  He is complaining of increased urinary frequency and urinary hesitation symptoms. He filled out a questionnaire regarding urinary function today providing and overall IPSS score of 17 characterizing his symptoms as moderate.  His pre-implant score was 12. He denies any bowel symptoms.  ALLERGIES:  has No Known Allergies.  Meds: Current Outpatient Medications  Medication Sig Dispense Refill  . acetaminophen (TYLENOL) 500 MG tablet Take 500 mg by mouth every 6 (six) hours as needed.    Marland Kitchen albuterol (PROVENTIL HFA;VENTOLIN HFA) 108 (90 BASE) MCG/ACT inhaler Inhale 2 puffs into the lungs every 6 (six) hours as needed. For wheezing.    . budesonide-formoterol (SYMBICORT) 160-4.5 MCG/ACT inhaler Inhale 2 puffs into the lungs 2 (two) times daily.     Marland Kitchen escitalopram (LEXAPRO) 10 MG tablet Take 15 mg by mouth every morning. Takes 1 and 1/2 tablet per daily = 15mg     . ferrous sulfate 325 (65 FE) MG tablet Take 325 mg by mouth daily with breakfast. Per pt takes every other day    . Melatonin 10 MG CAPS Take 10 mg by mouth at bedtime as needed.     . Multiple Vitamin (MULTIVITAMIN) tablet Take 1 tablet by mouth every other day.     . senna-docusate (SENOKOT-S) 8.6-50 MG tablet Take 1 tablet by mouth 2 (two) times daily. While taking strong pain meds to prevent connstipation 20  tablet 0  . tamsulosin (FLOMAX) 0.4 MG CAPS capsule Take 1 capsule (0.4 mg total) by mouth daily as needed. For urinary urgency after prostate radiation 30 capsule 3  . tetrahydrozoline 0.05 % ophthalmic solution as needed.    . traMADol (ULTRAM) 50 MG tablet Take 1-2 tablets (50-100 mg total) by mouth every 6 (six) hours as needed for moderate pain or severe pain. Post-operatively. 20 tablet 0  . vitamin B-12 (CYANOCOBALAMIN) 1000 MCG tablet Take 1,000 mcg by mouth every other day.      No current facility-administered medications for this encounter.     Physical Findings: In general this is a well appearing Caucasian male in no acute distress. He's alert and oriented x4 and appropriate throughout the examination. Cardiopulmonary assessment is negative for acute distress and he exhibits normal effort.   Lab Findings: Lab Results  Component Value Date   WBC 9.8 10/25/2017   HGB 13.1 10/25/2017   HCT 41.8 10/25/2017   MCV 81.0 10/25/2017   PLT 333 10/25/2017    Radiographic Findings:  Patient underwent CT imaging in our clinic for post implant dosimetry. The CT was reviewed by Dr. Tammi Klippel and appears to demonstrate an adequate distribution of radioactive seeds throughout the prostate gland. There are no seeds in or near the rectum. He is scheduled for prostate MRI at 3:30pm today and these images will be fused with CT images for further evaluation.  We suspect the final  radiation plan and dosimetry will show appropriate coverage of the prostate gland.   Impression/Plan: The patient is recovering from the effects of radiation. His urinary symptoms should gradually improve over the next 4-6 months. We talked about this today. He is encouraged by his improvement already and is otherwise pleased with his outcome. We also talked about long-term follow-up for prostate cancer following seed implant. He understands that ongoing PSA determinations and digital rectal exams will help perform  surveillance to rule out disease recurrence. He has a follow up appointment scheduled with Dr. Tresa Moore on 03/06/18. He understands what to expect with his PSA measures. Patient was also educated today about some of the long-term effects from radiation including a small risk for rectal bleeding and possibly erectile dysfunction. We talked about some of the general management approaches to these potential complications. However, I did encourage the patient to contact our office or return at any point if he has questions or concerns related to his previous radiation and prostate cancer.    Nicholos Johns, PA-C    Tyler Pita, MD  [facility] Direct Dial: [phone]  Fax: [phone] [email or web address]  Skype  [email or web address]    Page Me   This document serves as a record of services personally performed by Tyler Pita, MD and Freeman Caldron, PA-C. It was created on their behalf by Rae Lips, a trained medical scribe. The creation of this record is based on the scribe's personal observations and the providers' statements to them. This document has been checked and approved by the attending providers.

## 2017-11-10 NOTE — Progress Notes (Signed)
Weight and vitals stable. Denies pain. Pre seed IPSS 12. Post seed IPSS 17. Reports dysuria resolved 3-4 days following seed placement. Denies hematuria. Denies urinary leakage or incontinence Reports he no longer has to take Flomax to manage his LUTS. Reports his bowels are soft related to Lexapro. Scheduled to follow up with PCP on 12/6 and urologist on 12/23. MRI scheduled for today at 1530 to confirm SpaceOar placement.   BP 112/89 (BP Location: Left Arm, Patient Position: Sitting)   Pulse 90   Temp 99 F (37.2 C) (Oral)   Resp 20   Ht 5\' 10"  (1.778 m)   Wt 155 lb 12.8 oz (70.7 kg)   SpO2 99%   BMI 22.35 kg/m  Wt Readings from Last 3 Encounters:  11/10/17 155 lb 12.8 oz (70.7 kg)  10/31/17 154 lb 11.2 oz (70.2 kg)  08/22/17 158 lb 3.2 oz (71.8 kg)

## 2017-11-15 ENCOUNTER — Institutional Professional Consult (permissible substitution): Payer: Medicare Other | Admitting: Internal Medicine

## 2017-11-18 ENCOUNTER — Ambulatory Visit: Payer: Medicare Other | Attending: Radiation Oncology | Admitting: Radiation Oncology

## 2017-11-18 ENCOUNTER — Encounter: Payer: Self-pay | Admitting: Radiation Oncology

## 2017-11-18 ENCOUNTER — Ambulatory Visit (HOSPITAL_COMMUNITY): Payer: Medicare Other

## 2017-11-18 DIAGNOSIS — C61 Malignant neoplasm of prostate: Secondary | ICD-10-CM | POA: Insufficient documentation

## 2017-11-22 NOTE — Progress Notes (Signed)
  Radiation Oncology         [phone] ________________________________  Name: LAMARCUS SPIRA MRN: [id number]  Date: 11/18/2017  DOB: [birth year]  3D Planning Note   Prostate Brachytherapy Post-Implant Dosimetry  Diagnosis: 62 y.o.gentleman with stage T2badenocarcinoma of the prostate with a Gleason's score of 4+3and a PSA of 6.5.  Narrative: On a previous date, Delphin C Chavarin returned following prostate seed implantation for post implant planning. He underwent CT scan complex simulation to delineate the three-dimensional structures of the pelvis and demonstrate the radiation distribution.  Since that time, the seed localization, and complex isodose planning with dose volume histograms have now been completed.  Results:   Prostate Coverage - The dose of radiation delivered to the 90% or more of the prostate gland (D90) was 127.56% of the prescription dose. This exceeds our goal of greater than 90%. Rectal Sparing - The volume of rectal tissue receiving the prescription dose or higher was 0.0 cc. This falls under our thresholds tolerance of 1.0 cc.  Impression: The prostate seed implant appears to show adequate target coverage and appropriate rectal sparing.  Plan:  The patient will continue to follow with urology for ongoing PSA determinations. I would anticipate a high likelihood for local tumor control with minimal risk for rectal morbidity.  ________________________________  Sheral Apley Tammi Klippel, M.D.

## 2017-12-31 DIAGNOSIS — Z23 Encounter for immunization: Secondary | ICD-10-CM | POA: Diagnosis not present

## 2018-02-17 DIAGNOSIS — E785 Hyperlipidemia, unspecified: Secondary | ICD-10-CM | POA: Diagnosis not present

## 2018-02-17 DIAGNOSIS — F419 Anxiety disorder, unspecified: Secondary | ICD-10-CM | POA: Diagnosis not present

## 2018-02-17 DIAGNOSIS — J449 Chronic obstructive pulmonary disease, unspecified: Secondary | ICD-10-CM | POA: Diagnosis not present

## 2018-02-17 DIAGNOSIS — C61 Malignant neoplasm of prostate: Secondary | ICD-10-CM | POA: Diagnosis not present

## 2018-03-06 DIAGNOSIS — R3915 Urgency of urination: Secondary | ICD-10-CM | POA: Diagnosis not present

## 2018-03-06 DIAGNOSIS — N5201 Erectile dysfunction due to arterial insufficiency: Secondary | ICD-10-CM | POA: Diagnosis not present

## 2018-03-06 DIAGNOSIS — C61 Malignant neoplasm of prostate: Secondary | ICD-10-CM | POA: Diagnosis not present

## 2018-08-21 DIAGNOSIS — J449 Chronic obstructive pulmonary disease, unspecified: Secondary | ICD-10-CM | POA: Diagnosis not present

## 2018-08-21 DIAGNOSIS — E785 Hyperlipidemia, unspecified: Secondary | ICD-10-CM | POA: Diagnosis not present

## 2018-08-21 DIAGNOSIS — C61 Malignant neoplasm of prostate: Secondary | ICD-10-CM | POA: Diagnosis not present

## 2018-08-21 DIAGNOSIS — D649 Anemia, unspecified: Secondary | ICD-10-CM | POA: Diagnosis not present

## 2018-08-21 DIAGNOSIS — Z87891 Personal history of nicotine dependence: Secondary | ICD-10-CM | POA: Diagnosis not present

## 2018-08-21 DIAGNOSIS — F419 Anxiety disorder, unspecified: Secondary | ICD-10-CM | POA: Diagnosis not present

## 2018-08-21 DIAGNOSIS — Z0000 Encounter for general adult medical examination without abnormal findings: Secondary | ICD-10-CM | POA: Diagnosis not present

## 2018-08-21 DIAGNOSIS — Z1389 Encounter for screening for other disorder: Secondary | ICD-10-CM | POA: Diagnosis not present

## 2018-08-23 ENCOUNTER — Other Ambulatory Visit: Payer: Self-pay | Admitting: Physician Assistant

## 2018-08-23 DIAGNOSIS — D649 Anemia, unspecified: Secondary | ICD-10-CM | POA: Diagnosis not present

## 2018-08-23 DIAGNOSIS — C61 Malignant neoplasm of prostate: Secondary | ICD-10-CM | POA: Diagnosis not present

## 2018-08-23 DIAGNOSIS — Z87891 Personal history of nicotine dependence: Secondary | ICD-10-CM

## 2018-08-23 DIAGNOSIS — Z0000 Encounter for general adult medical examination without abnormal findings: Secondary | ICD-10-CM | POA: Diagnosis not present

## 2018-08-23 DIAGNOSIS — E785 Hyperlipidemia, unspecified: Secondary | ICD-10-CM | POA: Diagnosis not present

## 2018-08-23 DIAGNOSIS — Z23 Encounter for immunization: Secondary | ICD-10-CM | POA: Diagnosis not present

## 2018-09-01 ENCOUNTER — Ambulatory Visit
Admission: RE | Admit: 2018-09-01 | Discharge: 2018-09-01 | Disposition: A | Discharge status: Home / Self Care | Payer: PPO | Source: Ambulatory Visit | Attending: Physician Assistant | Admitting: Physician Assistant

## 2018-09-01 ENCOUNTER — Other Ambulatory Visit: Payer: Self-pay

## 2018-09-01 DIAGNOSIS — Z87891 Personal history of nicotine dependence: Secondary | ICD-10-CM | POA: Diagnosis not present

## 2018-09-01 DIAGNOSIS — I700 Atherosclerosis of aorta: Secondary | ICD-10-CM | POA: Diagnosis not present

## 2018-09-01 DIAGNOSIS — Z136 Encounter for screening for cardiovascular disorders: Secondary | ICD-10-CM | POA: Diagnosis not present

## 2018-09-04 DIAGNOSIS — C61 Malignant neoplasm of prostate: Secondary | ICD-10-CM | POA: Diagnosis not present

## 2018-09-04 DIAGNOSIS — N5201 Erectile dysfunction due to arterial insufficiency: Secondary | ICD-10-CM | POA: Diagnosis not present

## 2018-09-04 DIAGNOSIS — R3915 Urgency of urination: Secondary | ICD-10-CM | POA: Diagnosis not present

## 2018-09-04 DIAGNOSIS — R300 Dysuria: Secondary | ICD-10-CM | POA: Diagnosis not present

## 2019-09-20 DIAGNOSIS — J449 Chronic obstructive pulmonary disease, unspecified: Secondary | ICD-10-CM | POA: Diagnosis not present

## 2019-09-20 DIAGNOSIS — C61 Malignant neoplasm of prostate: Secondary | ICD-10-CM | POA: Diagnosis not present

## 2019-09-20 DIAGNOSIS — F419 Anxiety disorder, unspecified: Secondary | ICD-10-CM | POA: Diagnosis not present

## 2019-09-20 DIAGNOSIS — D649 Anemia, unspecified: Secondary | ICD-10-CM | POA: Diagnosis not present

## 2020-01-10 DIAGNOSIS — Z0000 Encounter for general adult medical examination without abnormal findings: Secondary | ICD-10-CM | POA: Diagnosis not present

## 2020-01-10 DIAGNOSIS — F419 Anxiety disorder, unspecified: Secondary | ICD-10-CM | POA: Diagnosis not present

## 2020-01-10 DIAGNOSIS — Z23 Encounter for immunization: Secondary | ICD-10-CM | POA: Diagnosis not present

## 2020-01-10 DIAGNOSIS — D649 Anemia, unspecified: Secondary | ICD-10-CM | POA: Diagnosis not present

## 2020-01-10 DIAGNOSIS — C61 Malignant neoplasm of prostate: Secondary | ICD-10-CM | POA: Diagnosis not present

## 2020-01-10 DIAGNOSIS — E785 Hyperlipidemia, unspecified: Secondary | ICD-10-CM | POA: Diagnosis not present

## 2020-01-10 DIAGNOSIS — J449 Chronic obstructive pulmonary disease, unspecified: Secondary | ICD-10-CM | POA: Diagnosis not present

## 2020-01-16 DIAGNOSIS — Z0000 Encounter for general adult medical examination without abnormal findings: Secondary | ICD-10-CM | POA: Diagnosis not present

## 2020-04-14 DIAGNOSIS — J441 Chronic obstructive pulmonary disease with (acute) exacerbation: Secondary | ICD-10-CM | POA: Diagnosis not present

## 2020-04-14 DIAGNOSIS — R0602 Shortness of breath: Secondary | ICD-10-CM | POA: Diagnosis not present

## 2020-04-14 DIAGNOSIS — R059 Cough, unspecified: Secondary | ICD-10-CM | POA: Diagnosis not present

## 2020-04-14 DIAGNOSIS — Z20822 Contact with and (suspected) exposure to covid-19: Secondary | ICD-10-CM | POA: Diagnosis not present

## 2020-05-11 IMAGING — DX DG CHEST 2V
2 series · 2 of 2 positions shown · non-contrast
Comparison: Chest x-rays dated 04/28/2017 and 04/04/2013

CLINICAL DATA: Postoperative for prostate seed implants. Emphysema.

EXAM:
CHEST - 2 VIEW

[chest pa]
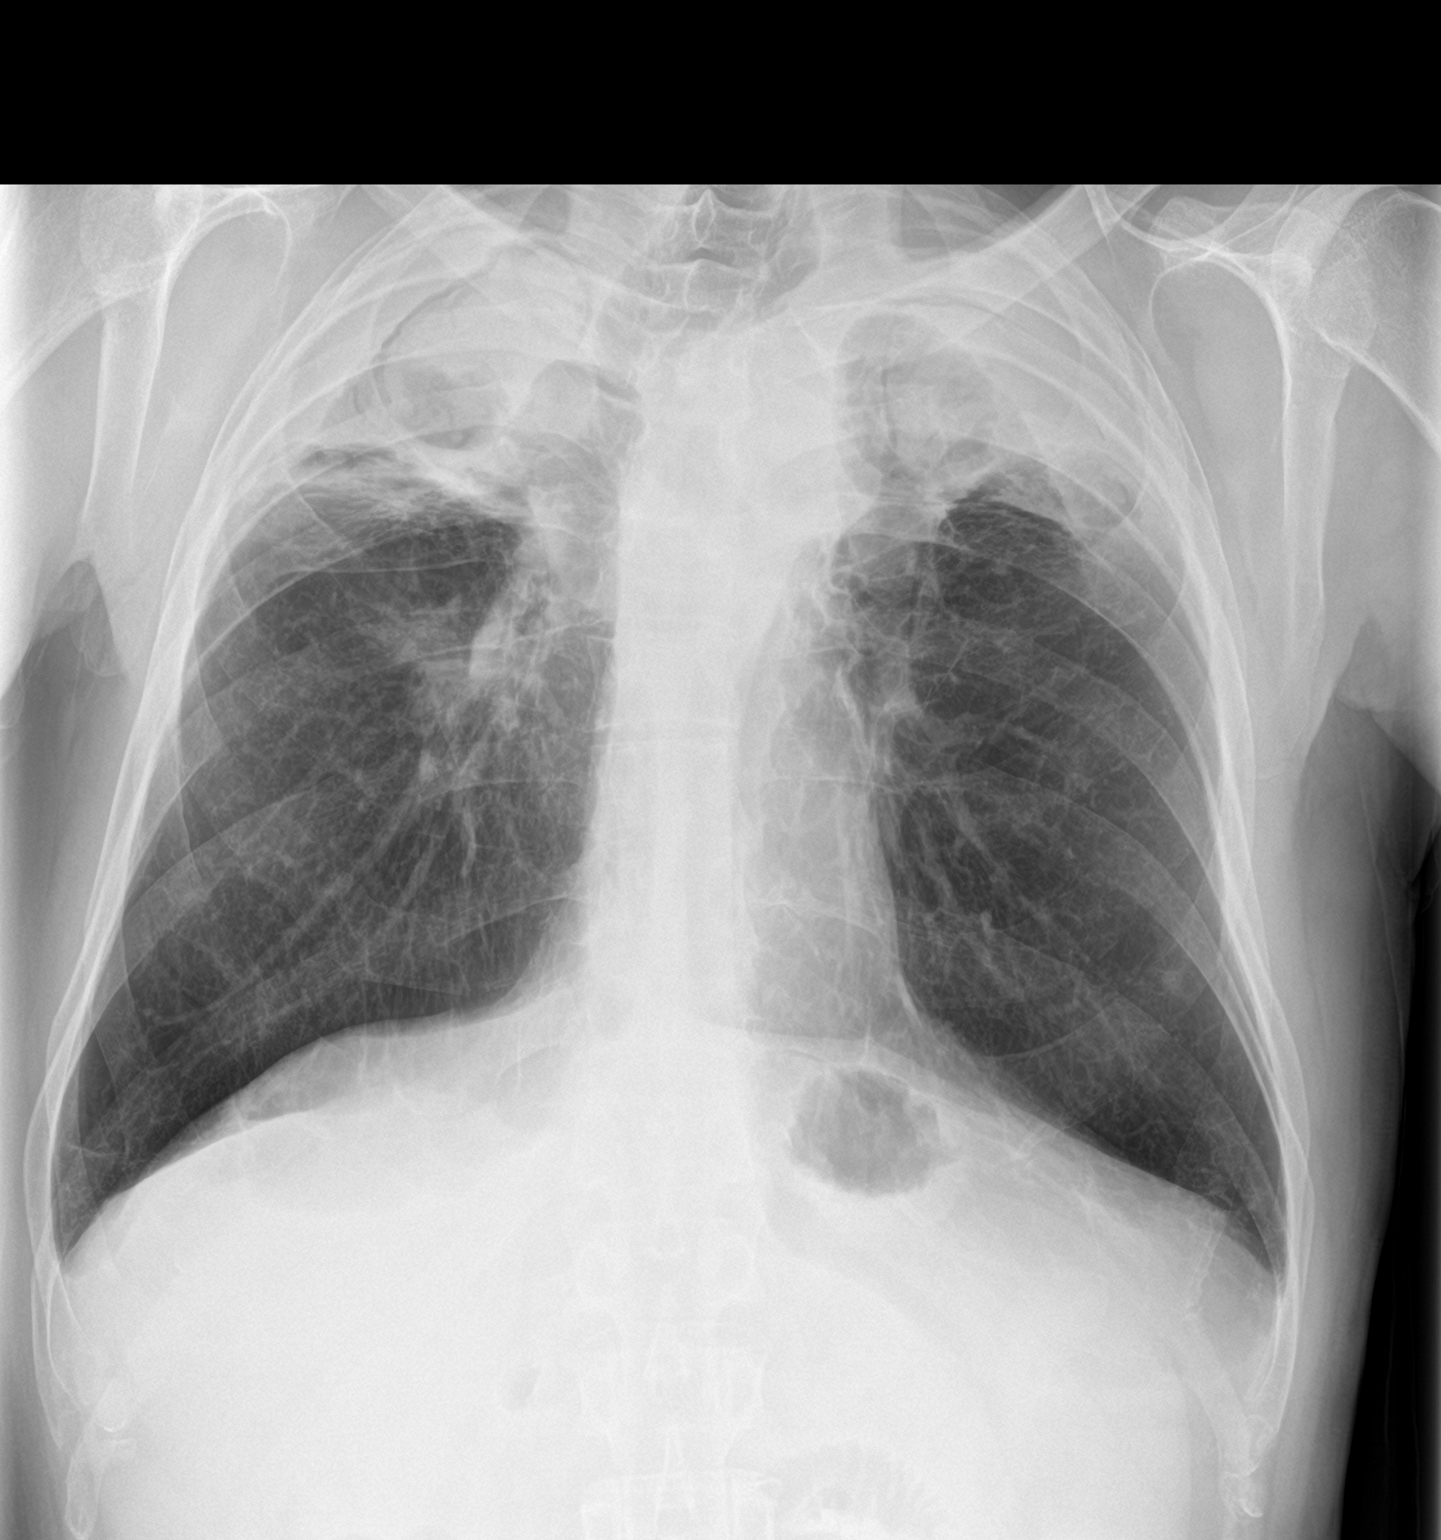

[chest lat]
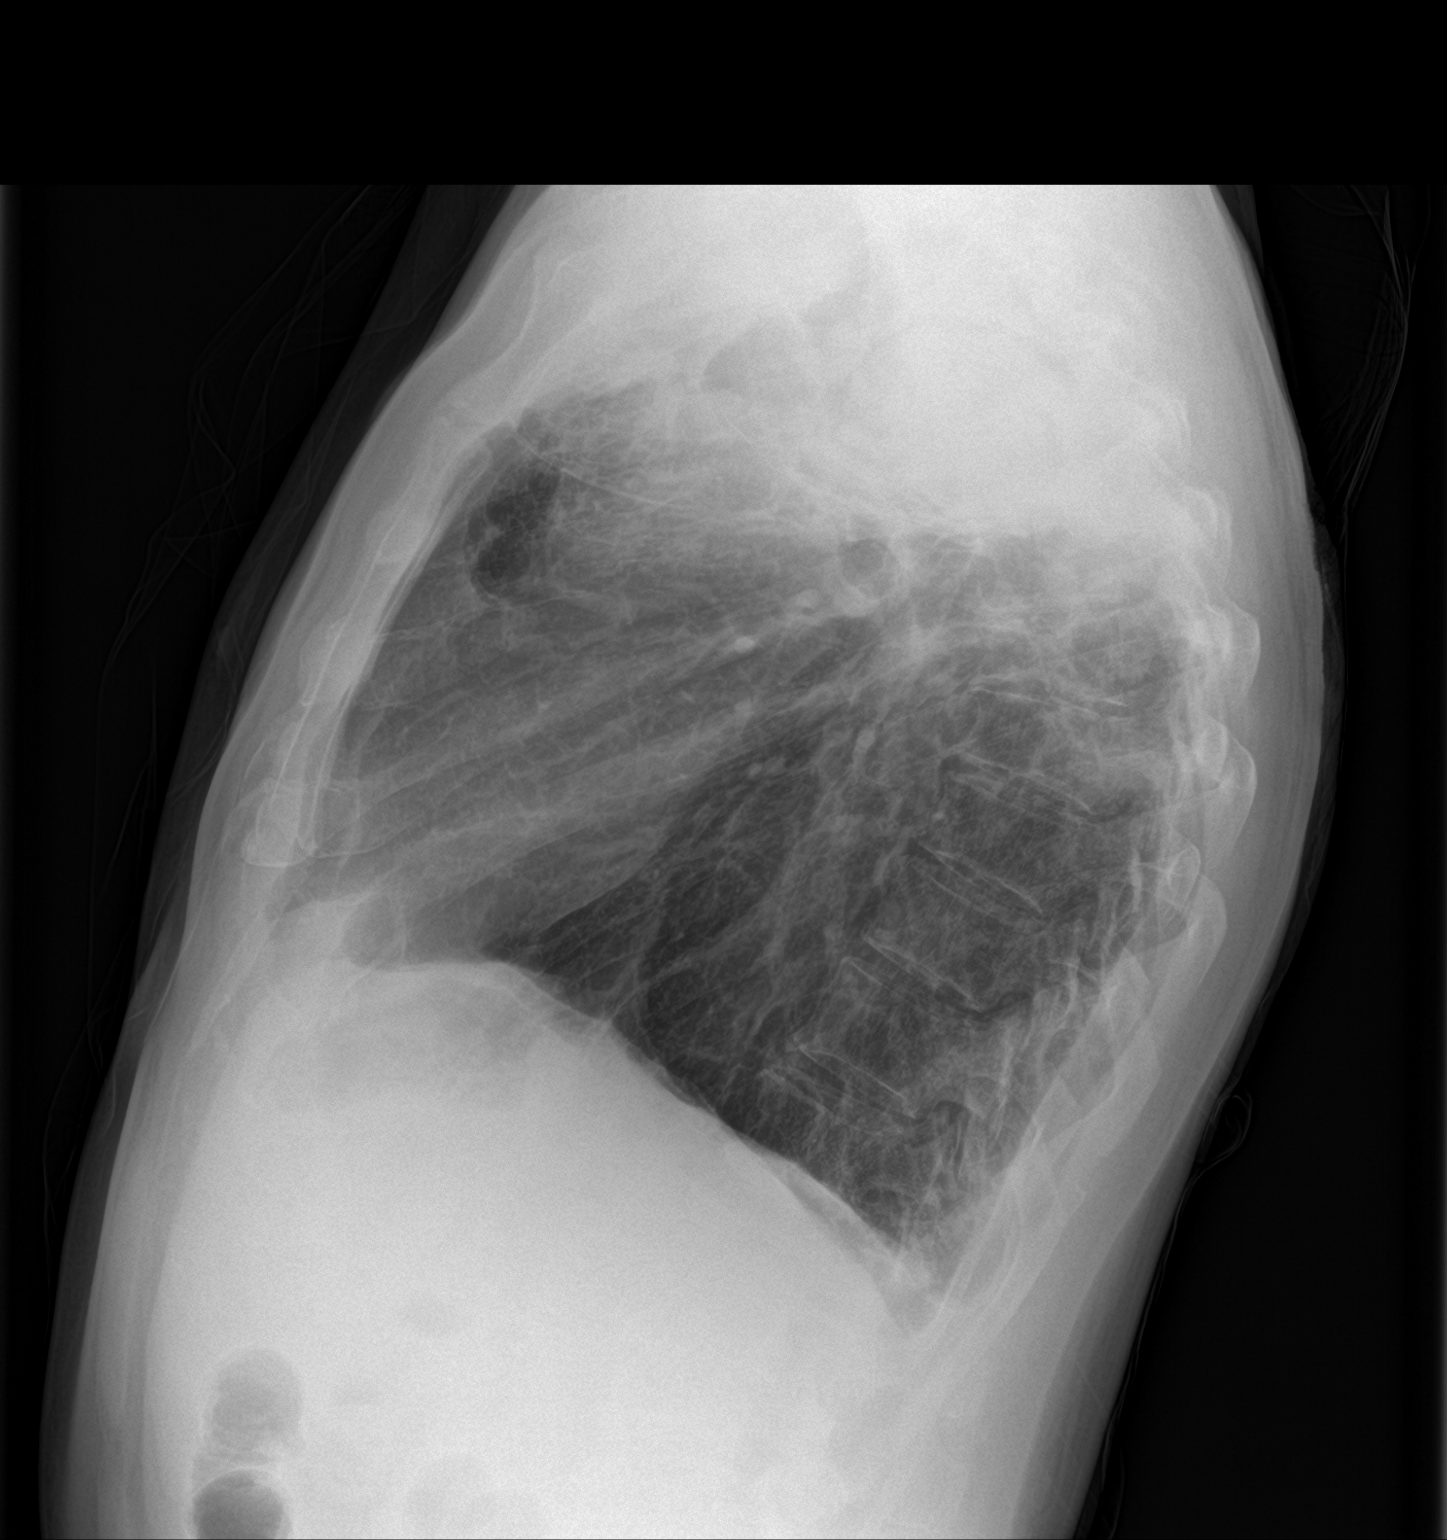

[2 of 2 positions shown; findings below may reference images not displayed]

FINDINGS: The patient has severe bullous emphysema and scarring and retraction
in both upper lobes. He has developed prominent soft tissue
densities in the Foley at both lung apices, 6.7 cm on the right and
5.8 cm on the left. These may represent fungus balls in the bullae.

The heart size and pulmonary vascularity are normal. Chronic
emphysematous changes are present throughout both lungs. Small
nipple shadows at the lung bases. No effusions. Chronic accentuation
of the upper thoracic kyphosis with chronic anterior wedge deformity
of at least 1 midthoracic vertebra.
IMPRESSION: The patient has abnormal soft tissue masses in blebs in both lung
apices worrisome for fungus balls within large bullae at the lung
apices.

Chronic severe lung disease with extensive scarring and volume loss
in the upper lobes.

## 2020-05-13 DEATH — deceased

## 2021-05-13 DEATH — deceased
